# Patient Record
Sex: Male | Born: 1935 | Race: White | Hispanic: No | Marital: Married | Smoking: Former smoker
Health system: Southern US, Community
[De-identification: ages and names within clinical notes are randomized; demographics above are authoritative.]

## PROBLEM LIST (undated history)

## (undated) DIAGNOSIS — D649 Anemia, unspecified: Secondary | ICD-10-CM

## (undated) DIAGNOSIS — A809 Acute poliomyelitis, unspecified: Secondary | ICD-10-CM

## (undated) DIAGNOSIS — S72309A Unspecified fracture of shaft of unspecified femur, initial encounter for closed fracture: Secondary | ICD-10-CM

## (undated) DIAGNOSIS — D471 Chronic myeloproliferative disease: Secondary | ICD-10-CM

## (undated) DIAGNOSIS — K2210 Ulcer of esophagus without bleeding: Secondary | ICD-10-CM

## (undated) DIAGNOSIS — G9349 Other encephalopathy: Secondary | ICD-10-CM

## (undated) DIAGNOSIS — I4891 Unspecified atrial fibrillation: Secondary | ICD-10-CM

## (undated) DIAGNOSIS — I639 Cerebral infarction, unspecified: Secondary | ICD-10-CM

## (undated) DIAGNOSIS — S36039A Unspecified laceration of spleen, initial encounter: Secondary | ICD-10-CM

## (undated) DIAGNOSIS — N189 Chronic kidney disease, unspecified: Secondary | ICD-10-CM

## (undated) DIAGNOSIS — G9341 Metabolic encephalopathy: Secondary | ICD-10-CM

## (undated) DIAGNOSIS — N19 Unspecified kidney failure: Secondary | ICD-10-CM

## (undated) DIAGNOSIS — J189 Pneumonia, unspecified organism: Secondary | ICD-10-CM

## (undated) DIAGNOSIS — N186 End stage renal disease: Secondary | ICD-10-CM

## (undated) DIAGNOSIS — K922 Gastrointestinal hemorrhage, unspecified: Secondary | ICD-10-CM

## (undated) DIAGNOSIS — H3530 Unspecified macular degeneration: Secondary | ICD-10-CM

## (undated) DIAGNOSIS — I4892 Unspecified atrial flutter: Secondary | ICD-10-CM

## (undated) DIAGNOSIS — IMO0002 Reserved for concepts with insufficient information to code with codable children: Secondary | ICD-10-CM

## (undated) DIAGNOSIS — I219 Acute myocardial infarction, unspecified: Secondary | ICD-10-CM

## (undated) DIAGNOSIS — Z862 Personal history of diseases of the blood and blood-forming organs and certain disorders involving the immune mechanism: Secondary | ICD-10-CM

## (undated) HISTORY — PX: OTHER SURGICAL HISTORY: SHX169

## (undated) HISTORY — DX: Acute poliomyelitis, unspecified: A80.9

## (undated) HISTORY — DX: Unspecified kidney failure: N19

## (undated) HISTORY — DX: Reserved for concepts with insufficient information to code with codable children: IMO0002

## (undated) HISTORY — DX: Unspecified macular degeneration: H35.30

## (undated) HISTORY — DX: Acute myocardial infarction, unspecified: I21.9

## (undated) HISTORY — DX: Gastrointestinal hemorrhage, unspecified: K92.2

## (undated) HISTORY — DX: End stage renal disease: N18.6

## (undated) HISTORY — PX: EYE SURGERY: SHX253

## (undated) HISTORY — PX: COLON SURGERY: SHX602

## (undated) HISTORY — DX: Unspecified atrial flutter: I48.92

## (undated) HISTORY — DX: Other encephalopathy: G93.49

## (undated) HISTORY — DX: Ulcer of esophagus without bleeding: K22.10

## (undated) HISTORY — DX: Chronic kidney disease, unspecified: N18.9

## (undated) HISTORY — DX: Anemia, unspecified: D64.9

## (undated) HISTORY — DX: Chronic myeloproliferative disease: D47.1

## (undated) HISTORY — DX: Personal history of diseases of the blood and blood-forming organs and certain disorders involving the immune mechanism: Z86.2

## (undated) HISTORY — DX: Metabolic encephalopathy: G93.41

---
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
## 1994-03-16 DIAGNOSIS — I219 Acute myocardial infarction, unspecified: Secondary | ICD-10-CM

## 1994-03-16 HISTORY — PX: CARDIAC CATHETERIZATION: SHX172

## 1994-03-16 HISTORY — DX: Acute myocardial infarction, unspecified: I21.9

## 2002-03-16 HISTORY — PX: OTHER SURGICAL HISTORY: SHX169

## 2006-09-14 HISTORY — PX: COLON RESECTION: SHX5231

## 2012-06-30 ENCOUNTER — Other Ambulatory Visit: Payer: Self-pay | Admitting: *Deleted

## 2012-06-30 DIAGNOSIS — Z01810 Encounter for preprocedural cardiovascular examination: Secondary | ICD-10-CM

## 2012-06-30 DIAGNOSIS — N184 Chronic kidney disease, stage 4 (severe): Secondary | ICD-10-CM

## 2012-07-04 ENCOUNTER — Encounter: Payer: Self-pay | Admitting: Vascular Surgery

## 2012-07-18 ENCOUNTER — Encounter: Payer: Self-pay | Admitting: Vascular Surgery

## 2012-07-19 ENCOUNTER — Ambulatory Visit: Payer: Self-pay | Admitting: Vascular Surgery

## 2012-07-25 ENCOUNTER — Encounter: Payer: Self-pay | Admitting: Vascular Surgery

## 2012-07-26 ENCOUNTER — Ambulatory Visit (INDEPENDENT_AMBULATORY_CARE_PROVIDER_SITE_OTHER): Payer: Medicare Other | Admitting: Vascular Surgery

## 2012-07-26 ENCOUNTER — Encounter: Payer: Self-pay | Admitting: Vascular Surgery

## 2012-07-26 VITALS — BP 123/69 | HR 110 | Resp 20 | Ht 71.0 in | Wt 256.8 lb

## 2012-07-26 DIAGNOSIS — N186 End stage renal disease: Secondary | ICD-10-CM

## 2012-07-26 NOTE — Progress Notes (Signed)
[facility] [place]   Patient name: Brent Mcneil MRN: [id number] DOB: December 03, 1923 Sex: male   Referred by: Caryn Section  Reason for referral:  Chief Complaint  Patient presents with  . Follow-up    dialysis M-W-F  needs permanent access    HISTORY OF PRESENT ILLNESS: Patient is an 79 year old gentleman with a long history of progressive renal insufficiency. He initiated hemodialysis in [place] April. He had a dialysis catheter placed in an outside facility and has had a good hemodialysis via the catheter. I do have a venous mapping done at an outlying hospital and had this for review. He has multiple other medical code the comorbid oh these and is gets around in a motorized scooter  Past Medical History  Diagnosis Date  . Chronic kidney disease     HD- M-W-F  . End stage renal disease   . Uremic encephalopathy     Metabolic  . Anemia   . Gastrointestinal bleed   . Ulcer     Peptic and gastric   . Ulcerative esophagitis   . Atrial flutter   . Myeloproliferative disorder   . Macular degeneration 1990s    bilateral  . History of thrombocytosis   . Myocardial infarction 1996  . Polio age 47    Past Surgical History  Procedure Laterality Date  . Colon resection  09-2006  . Tonsillectomy  1958  . Orthopedic surgeries  as a child    due to polio  . Cataract surgery Right 2004    History   Social History  . Marital Status: Married    Spouse Name: N/A    Number of Children: N/A  . Years of Education: N/A   Occupational History  . Not on file.   Social History Main Topics  . Smoking status: Former Smoker    Quit date: 09/28/1956  . Smokeless tobacco: Not on file  . Alcohol Use: No  . Drug Use: No  . Sexually Active: Not on file   Other Topics Concern  . Not on file   Social History Narrative  . No narrative on file    History reviewed. No pertinent family history.  Allergies as of 07/26/2012  . (No Known Allergies)    Current  Outpatient Prescriptions on File Prior to Visit  Medication Sig Dispense Refill  . acetaminophen (TYLENOL) 100 MG/ML solution Take by mouth every 4 (four) hours as needed for fever. Takes 1 tablet 650 mg every 4 hours prn.      . anagrelide (AGRYLIN) 0.5 MG capsule Take 0.5 mg by mouth every other day.      Marland Kitchen aspirin 81 MG tablet Take 81 mg by mouth daily.      . beta carotene 15 MG capsule Take 15 mg by mouth daily.      . cetirizine (ZYRTEC) 10 MG tablet Take 10 mg by mouth daily.      Marland Kitchen diltiazem (CARDIZEM) 30 MG tablet Take 30 mg by mouth every 6 (six) hours.      . febuxostat (ULORIC) 40 MG tablet Take 40 mg by mouth daily.       . pantoprazole (PROTONIX) 40 MG tablet Take 40 mg by mouth daily.      . tamsulosin (FLOMAX) 0.4 MG CAPS Take by mouth.      . sodium bicarbonate 650 MG tablet Take 650 mg by mouth 2 (two) times daily.       No current facility-administered medications on file prior to  visit.     REVIEW OF SYSTEMS:  Positives indicated with an "X"  CARDIOVASCULAR:  [ ]  chest pain   [ ]  chest pressure   [ ]  palpitations   [ ]  orthopnea   [ ]  dyspnea on exertion   [ ]  claudication   [ ]  rest pain   [ ]  DVT   [ ]  phlebitis PULMONARY:   [ ]  productive cough   [ ]  asthma   [ ]  wheezing NEUROLOGIC:   [ ]  weakness  [ ]  paresthesias  [ ]  aphasia  [ ]  amaurosis  [ ]  dizziness HEMATOLOGIC:   [ ]  bleeding problems   [x ] clotting disorders MUSCULOSKELETAL:  [ ]  joint pain   [ ]  joint swelling GASTROINTESTINAL: [ ]   blood in stool  [ ]   hematemesis GENITOURINARY:  [x ]  dysuria  [ ]   hematuria PSYCHIATRIC:  [ ]  history of major depression INTEGUMENTARY:  [ ]  rashes  [ ]  ulcers CONSTITUTIONAL:  [ ]  fever   [ ]  chills  PHYSICAL EXAMINATION:  General: The patient is a well-nourished male, in no acute distress. Vital signs are BP 123/69  Pulse 110  Resp 20  Ht 5\' 11"  (1.803 m)  Wt 256 lb 13.4 oz (116.5 kg)  BMI 35.84 kg/m2 Pulmonary: There is a good air exchange bilaterally   Abdomen: Soft and non-tender  Musculoskeletal: There are no major deformities.  There is no significant extremity pain. Neurologic: No focal weakness or paresthesias are detected, Skin: There are no ulcer or rashes noted. Psychiatric: The patient has normal affect. 2+ radial pulses bilaterally    Vascular Lab Studies:  Outside venous acting shows inadequate cephalic vein on his right arm moderate size in the left arm. The basilic vein was small and diminutiv  the right arm and left arm myself and he does have acceptable cephalic vein in the left arm for a radiocephalic Cimino fistula creation Impression and Plan:  Impression and plan end-stage renal disease due to a long discussion with the patient and his daughter present explaining the catheter option AV fistula an AV graft option. I have recommended left AV fistula creation. The patient understands that it will require 3 months for maturation that he may have non-maturation. He dialyzes on Monday and Wednesday and Friday and therefore will be scheduled on a Tuesday or Thursday with surgery from one of my partners    Amel Kitch [facility] [place] Office: [phone]

## 2012-08-03 ENCOUNTER — Other Ambulatory Visit: Payer: Self-pay | Admitting: *Deleted

## 2012-08-12 ENCOUNTER — Encounter (HOSPITAL_COMMUNITY): Payer: Self-pay | Admitting: Pharmacy Technician

## 2012-08-14 DIAGNOSIS — I4891 Unspecified atrial fibrillation: Secondary | ICD-10-CM

## 2012-08-14 HISTORY — DX: Unspecified atrial fibrillation: I48.91

## 2012-08-17 ENCOUNTER — Encounter (HOSPITAL_COMMUNITY): Payer: Self-pay | Admitting: *Deleted

## 2012-08-17 MED ORDER — DEXTROSE 5 % IV SOLN
1.5000 g | INTRAVENOUS | Status: AC
  Administered 2012-08-18: 1.5 g via INTRAVENOUS
  Filled 2012-08-17: qty 1.5

## 2012-08-18 ENCOUNTER — Ambulatory Visit (HOSPITAL_COMMUNITY)
Admission: RE | Admit: 2012-08-18 | Discharge: 2012-08-18 | Disposition: A | Discharge status: Home / Self Care | Payer: Medicare Other | Source: Ambulatory Visit | Attending: Surgery | Admitting: Surgery

## 2012-08-18 ENCOUNTER — Ambulatory Visit (HOSPITAL_COMMUNITY): Payer: Medicare Other

## 2012-08-18 ENCOUNTER — Encounter (HOSPITAL_COMMUNITY): Payer: Self-pay | Admitting: Anesthesiology

## 2012-08-18 ENCOUNTER — Telehealth: Payer: Self-pay | Admitting: *Deleted

## 2012-08-18 ENCOUNTER — Ambulatory Visit (HOSPITAL_COMMUNITY): Payer: Medicare Other | Admitting: Anesthesiology

## 2012-08-18 ENCOUNTER — Encounter (HOSPITAL_COMMUNITY): Payer: Self-pay | Admitting: *Deleted

## 2012-08-18 ENCOUNTER — Encounter (HOSPITAL_COMMUNITY)
Admission: RE | Disposition: A | Discharge status: Home / Self Care | Payer: Self-pay | Source: Ambulatory Visit | Attending: Surgery

## 2012-08-18 ENCOUNTER — Telehealth: Payer: Self-pay | Admitting: Surgery

## 2012-08-18 DIAGNOSIS — Z992 Dependence on renal dialysis: Secondary | ICD-10-CM | POA: Insufficient documentation

## 2012-08-18 DIAGNOSIS — N186 End stage renal disease: Secondary | ICD-10-CM | POA: Insufficient documentation

## 2012-08-18 DIAGNOSIS — Z79899 Other long term (current) drug therapy: Secondary | ICD-10-CM | POA: Insufficient documentation

## 2012-08-18 DIAGNOSIS — I120 Hypertensive chronic kidney disease with stage 5 chronic kidney disease or end stage renal disease: Secondary | ICD-10-CM | POA: Insufficient documentation

## 2012-08-18 DIAGNOSIS — Z87891 Personal history of nicotine dependence: Secondary | ICD-10-CM | POA: Insufficient documentation

## 2012-08-18 DIAGNOSIS — Z6835 Body mass index (BMI) 35.0-35.9, adult: Secondary | ICD-10-CM | POA: Insufficient documentation

## 2012-08-18 DIAGNOSIS — H3530 Unspecified macular degeneration: Secondary | ICD-10-CM | POA: Insufficient documentation

## 2012-08-18 DIAGNOSIS — I252 Old myocardial infarction: Secondary | ICD-10-CM | POA: Insufficient documentation

## 2012-08-18 DIAGNOSIS — D47Z9 Other specified neoplasms of uncertain behavior of lymphoid, hematopoietic and related tissue: Secondary | ICD-10-CM | POA: Insufficient documentation

## 2012-08-18 DIAGNOSIS — I4892 Unspecified atrial flutter: Secondary | ICD-10-CM | POA: Insufficient documentation

## 2012-08-18 DIAGNOSIS — Z8612 Personal history of poliomyelitis: Secondary | ICD-10-CM | POA: Insufficient documentation

## 2012-08-18 DIAGNOSIS — D649 Anemia, unspecified: Secondary | ICD-10-CM | POA: Insufficient documentation

## 2012-08-18 DIAGNOSIS — Z7982 Long term (current) use of aspirin: Secondary | ICD-10-CM | POA: Insufficient documentation

## 2012-08-18 HISTORY — PX: AV FISTULA PLACEMENT: SHX1204

## 2012-08-18 LAB — POCT I-STAT 4, (NA,K, GLUC, HGB,HCT)
Glucose, Bld: 94 mg/dL (ref 70–99)
Hemoglobin: 15 g/dL (ref 13.0–17.0)
Potassium: 3.7 mEq/L (ref 3.5–5.1)

## 2012-08-18 LAB — SURGICAL PCR SCREEN: MRSA, PCR: NEGATIVE

## 2012-08-18 SURGERY — ARTERIOVENOUS (AV) FISTULA CREATION
Anesthesia: Monitor Anesthesia Care | Site: Arm Lower | Laterality: Left | Wound class: Clean

## 2012-08-18 MED ORDER — MUPIROCIN 2 % EX OINT
TOPICAL_OINTMENT | CUTANEOUS | Status: AC
  Filled 2012-08-18: qty 22

## 2012-08-18 MED ORDER — SODIUM CHLORIDE 0.9 % IV SOLN
INTRAVENOUS | Status: DC | PRN
  Administered 2012-08-18: 09:00:00 via INTRAVENOUS

## 2012-08-18 MED ORDER — MUPIROCIN 2 % EX OINT
TOPICAL_OINTMENT | Freq: Once | CUTANEOUS | Status: AC
  Filled 2012-08-18: qty 22

## 2012-08-18 MED ORDER — FENTANYL CITRATE 0.05 MG/ML IJ SOLN: 25.0000 ug | INTRAMUSCULAR | Status: DC | PRN

## 2012-08-18 MED ORDER — SODIUM CHLORIDE 0.9 % IV SOLN: INTRAVENOUS | Status: DC

## 2012-08-18 MED ORDER — PROTAMINE SULFATE 10 MG/ML IV SOLN
INTRAVENOUS | Status: DC | PRN
  Administered 2012-08-18: 10 mg via INTRAVENOUS
  Administered 2012-08-18: 15 mg via INTRAVENOUS

## 2012-08-18 MED ORDER — LIDOCAINE HCL (CARDIAC) 20 MG/ML IV SOLN
INTRAVENOUS | Status: DC | PRN
  Administered 2012-08-18: 20 mg via INTRAVENOUS

## 2012-08-18 MED ORDER — SODIUM CHLORIDE 0.9 % IR SOLN
Status: DC | PRN
  Administered 2012-08-18: 10:00:00

## 2012-08-18 MED ORDER — PROPOFOL INFUSION 10 MG/ML OPTIME
INTRAVENOUS | Status: DC | PRN
  Administered 2012-08-18: 75 ug/kg/min via INTRAVENOUS

## 2012-08-18 MED ORDER — FENTANYL CITRATE 0.05 MG/ML IJ SOLN
INTRAMUSCULAR | Status: DC | PRN
  Administered 2012-08-18 (×2): 50 ug via INTRAVENOUS

## 2012-08-18 MED ORDER — HEPARIN SODIUM (PORCINE) 1000 UNIT/ML IJ SOLN
INTRAMUSCULAR | Status: DC | PRN
  Administered 2012-08-18: 3000 [IU] via INTRAVENOUS

## 2012-08-18 MED ORDER — MIDAZOLAM HCL 5 MG/5ML IJ SOLN
INTRAMUSCULAR | Status: DC | PRN
  Administered 2012-08-18 (×2): 1 mg via INTRAVENOUS

## 2012-08-18 MED ORDER — ONDANSETRON HCL 4 MG/2ML IJ SOLN
INTRAMUSCULAR | Status: DC | PRN
  Administered 2012-08-18: 4 mg via INTRAVENOUS

## 2012-08-18 MED ORDER — OXYCODONE HCL 5 MG/5ML PO SOLN: 5.0000 mg | Freq: Once | ORAL | Status: DC | PRN

## 2012-08-18 MED ORDER — OXYCODONE HCL 5 MG PO TABS
5.0000 mg | ORAL_TABLET | ORAL | Status: DC | PRN
Start: 2012-08-18 — End: 2012-10-07

## 2012-08-18 MED ORDER — 0.9 % SODIUM CHLORIDE (POUR BTL) OPTIME
TOPICAL | Status: DC | PRN
  Administered 2012-08-18: 1000 mL

## 2012-08-18 MED ORDER — THROMBIN 20000 UNITS EX SOLR
CUTANEOUS | Status: AC
  Filled 2012-08-18: qty 20000

## 2012-08-18 MED ORDER — OXYCODONE HCL 5 MG PO TABS: 5.0000 mg | ORAL_TABLET | Freq: Once | ORAL | Status: DC | PRN

## 2012-08-18 MED ORDER — LIDOCAINE-EPINEPHRINE (PF) 1 %-1:200000 IJ SOLN
INTRAMUSCULAR | Status: AC
  Filled 2012-08-18: qty 10

## 2012-08-18 MED ORDER — HEMOSTATIC AGENTS (NO CHARGE) OPTIME
TOPICAL | Status: DC | PRN
  Administered 2012-08-18: 1 via TOPICAL

## 2012-08-18 MED ORDER — LIDOCAINE-EPINEPHRINE (PF) 1 %-1:200000 IJ SOLN
INTRAMUSCULAR | Status: DC | PRN
  Administered 2012-08-18: 10 mL

## 2012-08-18 SURGICAL SUPPLY — 36 items
ARMBAND PINK RESTRICT EXTREMIT (MISCELLANEOUS) ×2 IMPLANT
CANISTER SUCTION 2500CC (MISCELLANEOUS) ×2 IMPLANT
CLIP TI MEDIUM 6 (CLIP) ×2 IMPLANT
CLIP TI WIDE RED SMALL 6 (CLIP) ×2 IMPLANT
CLOTH BEACON ORANGE TIMEOUT ST (SAFETY) ×2 IMPLANT
COVER PROBE W GEL 5X96 (DRAPES) ×2 IMPLANT
COVER SURGICAL LIGHT HANDLE (MISCELLANEOUS) ×2 IMPLANT
DERMABOND ADVANCED (GAUZE/BANDAGES/DRESSINGS) ×1
DERMABOND ADVANCED .7 DNX12 (GAUZE/BANDAGES/DRESSINGS) ×1 IMPLANT
ELECT REM PT RETURN 9FT ADLT (ELECTROSURGICAL) ×2
ELECTRODE REM PT RTRN 9FT ADLT (ELECTROSURGICAL) ×1 IMPLANT
GLOVE BIOGEL M 6.5 STRL (GLOVE) ×4 IMPLANT
GLOVE BIOGEL PI IND STRL 6.5 (GLOVE) ×2 IMPLANT
GLOVE BIOGEL PI IND STRL 7.5 (GLOVE) ×2 IMPLANT
GLOVE BIOGEL PI INDICATOR 6.5 (GLOVE) ×2
GLOVE BIOGEL PI INDICATOR 7.5 (GLOVE) ×2
GLOVE ECLIPSE 7.0 STRL STRAW (GLOVE) ×2 IMPLANT
GLOVE SURG SS PI 7.0 STRL IVOR (GLOVE) ×4 IMPLANT
GLOVE SURG SS PI 7.5 STRL IVOR (GLOVE) ×2 IMPLANT
GOWN PREVENTION PLUS XXLARGE (GOWN DISPOSABLE) ×2 IMPLANT
GOWN STRL NON-REIN LRG LVL3 (GOWN DISPOSABLE) ×2 IMPLANT
GOWN STRL REIN XL XLG (GOWN DISPOSABLE) ×6 IMPLANT
HEMOSTAT SNOW SURGICEL 2X4 (HEMOSTASIS) ×2 IMPLANT
KIT BASIN OR (CUSTOM PROCEDURE TRAY) ×2 IMPLANT
KIT ROOM TURNOVER OR (KITS) ×2 IMPLANT
NS IRRIG 1000ML POUR BTL (IV SOLUTION) ×2 IMPLANT
PACK CV ACCESS (CUSTOM PROCEDURE TRAY) ×2 IMPLANT
PAD ARMBOARD 7.5X6 YLW CONV (MISCELLANEOUS) ×4 IMPLANT
SUT PROLENE 6 0 CC (SUTURE) ×4 IMPLANT
SUT VIC AB 3-0 SH 27 (SUTURE) ×1
SUT VIC AB 3-0 SH 27X BRD (SUTURE) ×1 IMPLANT
SUT VICRYL 4-0 PS2 18IN ABS (SUTURE) IMPLANT
TOWEL OR 17X24 6PK STRL BLUE (TOWEL DISPOSABLE) ×2 IMPLANT
TOWEL OR 17X26 10 PK STRL BLUE (TOWEL DISPOSABLE) ×2 IMPLANT
UNDERPAD 30X30 INCONTINENT (UNDERPADS AND DIAPERS) ×2 IMPLANT
WATER STERILE IRR 1000ML POUR (IV SOLUTION) ×2 IMPLANT

## 2012-08-18 NOTE — OR Nursing (Signed)
IVT to meet pt in ssc to flush/cap diatek

## 2012-08-18 NOTE — Telephone Encounter (Signed)
Brent Mcneil Shots at [facility] called to clarify Brent Mcneil discharge pain medicine order. It was written today by our PA, Della Goo, for Oxycodone 5 mg 1 to 2 every 4-6 hours prn pain. Being an Assisted living facility, they can not enter variables in a Rx, so they are entering this Rx as 1 every 4 hours as needed for pain relief. I asked Lianne Cure, PA and she said this would be all right. Brent Mcneil will make the appropriate order in their system. She will contact us if this Rx is not effective in relieving the patient's pain.

## 2012-08-18 NOTE — Op Note (Signed)
[facility] [place]  Patient name: Brent Mcneil MRN: [id number] DOB: 19-Apr-1923 Sex: male  08/18/2012 Pre-operative Diagnosis: ESRD Post-operative diagnosis:  Same Surgeon:  Jorge Ny Assistants:  Doreatha Massed Procedure:   Left radiocephalic fistula Anesthesia:  MAC Blood Loss:  See anesthesia record Specimens:  None  Findings:  Vein distended to approximately 3.5 mm. The artery was calcified.  Indications:  The patient comes in for dialysis access. He was found to be in new onset A. fib approximately 1-2 weeks ago. We discussed this and elected to proceed. The patient will see a cardiologist and aspirin in the immediate future.  Procedure:  The patient was identified in the holding area and taken to [facility] OR ROOM 12  The patient was then placed supine on the table. MAC anesthesia was administered.  The patient was prepped and draped in the usual sterile fashion.  A time out was called and antibiotics were administered.  Ultrasound was used to map the course of the cephalic vein in the upper arm. Appeared to be adequate. One percent lidocaine was used for local anesthesia. A longitudinal incision was made between the artery and vein. The vein was sharply dissected free and was approximately 2 mm in situ. Side branches were ligated between silk ties. The vein was then marked with ink pen for orientation. I then dissected out the radial artery. This was a 2 mm artery which was moderately calcified. Once adequate exposure was obtained, the patient was given 3000 units of heparin. The vein was ligated distally. It distended to approximately 3.5 mm with heparinized saline. The artery was then occluded with vascular clamps. A #11 blade was used to make an arteriotomy which was extended longitudinally with Potts scissors. The vein was cut to length and then spatulated. A running end to side anastomosis was created with 6-0 Prolene. Prior to completion, the appropriate  flushing maneuvers were performed. The anastomosis was completed. There was a good thrill within the fistula up to the elbow. There was adequate, biphasic Doppler signal in the distal radial artery. 25 mg of protamine was administered. After the wound bed was hemostatic, the tissue was closed in 2 layers of 3-0 Vicryl and Dermabond was placed. There were no immediate complications.   Disposition:  To PACU in stable condition.   Juleen China, M.D. [facility] [place] Office: [phone] Pager:  [phone]

## 2012-08-18 NOTE — Anesthesia Procedure Notes (Signed)
Procedure Name: MAC Date/Time: 08/18/2012 9:35 AM Performed by: Brien Mates DOBSON Pre-anesthesia Checklist: Patient identified, Emergency Drugs available, Suction available, Patient being monitored and Timeout performed Patient Re-evaluated:Patient Re-evaluated prior to inductionOxygen Delivery Method: Simple face mask

## 2012-08-18 NOTE — Anesthesia Preprocedure Evaluation (Addendum)
Anesthesia Evaluation  Patient identified by MRN, date of birth, ID band Patient awake    Reviewed: Allergy & Precautions, H&P , NPO status , Patient's Chart, lab work & pertinent test results  History of Anesthesia Complications Negative for: history of anesthetic complications  Airway Mallampati: II TM Distance: >3 FB Neck ROM: Full    Dental  (+) Teeth Intact and Dental Advisory Given   Pulmonary former smoker,  breath sounds clear to auscultation        Cardiovascular hypertension, Pt. on medications + Past MI + dysrhythmias Atrial Fibrillation Rhythm:Irregular Rate:Normal     Neuro/Psych Mini strokes in the past - no residual.    GI/Hepatic Neg liver ROS, PUD, GERD-  Medicated and Controlled,  Endo/Other  negative endocrine ROS  Renal/GU ESRF and DialysisRenal diseaseHD x 6 weeks     Musculoskeletal   Abdominal   Peds  Hematology  (+) Blood dyscrasia, ,   Anesthesia Other Findings   Reproductive/Obstetrics negative OB ROS                          Anesthesia Physical Anesthesia Plan  ASA: III  Anesthesia Plan: MAC   Post-op Pain Management:    Induction: Intravenous  Airway Management Planned: Natural Airway  Additional Equipment:   Intra-op Plan:   Post-operative Plan: Extubation in OR  Informed Consent: I have reviewed the patients History and Physical, chart, labs and discussed the procedure including the risks, benefits and alternatives for the proposed anesthesia with the patient or authorized representative who has indicated his/her understanding and acceptance.     Plan Discussed with: CRNA and Surgeon  Anesthesia Plan Comments:         Anesthesia Quick Evaluation

## 2012-08-18 NOTE — Preoperative (Signed)
Beta Blockers   Reason not to administer Beta Blockers:Not Applicable 

## 2012-08-18 NOTE — Telephone Encounter (Addendum)
Message copied by Fredrich Birks on Thu Aug 18, 2012 12:27 PM ------      Message from: Melene Plan      Created: Thu Aug 18, 2012 10:47 AM                   ----- Message -----         From: Marlowe Shores, PA-C         Sent: 08/18/2012  10:28 AM           To: Melene Plan, RN, [facility] Pool            4-6 week F/U AVF - Brab ------  Spoke with patients daughter Arline Asp to notify, dpm

## 2012-08-18 NOTE — Interval H&P Note (Signed)
History and Physical Interval Note:  08/18/2012 7:31 AM  Brent Mcneil  has presented today for surgery, with the diagnosis of ESRD  The various methods of treatment have been discussed with the patient and family. After consideration of risks, benefits and other options for treatment, the patient has consented to  Procedure(s) with comments: ARTERIOVENOUS (AV) FISTULA CREATION (Left) - CIMINO as a surgical intervention .  The patient's history has been reviewed, patient examined, no change in status, stable for surgery.  I have reviewed the patient's chart and labs.  Questions were answered to the patient's satisfaction.     Brent Mcneil, V. WELL[facility]

## 2012-08-18 NOTE — OR Nursing (Signed)
Spoke with dr Myra Gianotti re need for f/u "new onset afib"...states has appt 6/19 with [facility], going home today.

## 2012-08-18 NOTE — Transfer of Care (Signed)
Immediate Anesthesia Transfer of Care Note  Patient: Brent Mcneil  Procedure(s) Performed: Procedure(s): ARTERIOVENOUS (AV) FISTULA CREATION (Left)  Patient Location: PACU  Anesthesia Type:MAC  Level of Consciousness: awake, alert , oriented and patient cooperative  Airway & Oxygen Therapy: Patient Spontanous Breathing and Patient connected to face mask oxygen  Post-op Assessment: Report given to PACU RN and Post -op Vital signs reviewed and stable  Post vital signs: Reviewed and stable  Complications: No apparent anesthesia complications

## 2012-08-18 NOTE — Progress Notes (Signed)
Dr. Jacklynn Bue called and notified of EKG results.  Anesthethia to assess pt in holding.  Advance Directive and HCPOA papers copied and placed in chart.

## 2012-08-18 NOTE — Anesthesia Postprocedure Evaluation (Signed)
  Anesthesia Post-op Note  Patient: Brent Mcneil  Procedure(s) Performed: Procedure(s): ARTERIOVENOUS (AV) FISTULA CREATION (Left)  Patient Location: PACU  Anesthesia Type:MAC  Level of Consciousness: awake  Airway and Oxygen Therapy: Patient Spontanous Breathing  Post-op Pain: mild  Post-op Assessment: Post-op Vital signs reviewed  Post-op Vital Signs: stable  Complications: No apparent anesthesia complications

## 2012-08-18 NOTE — H&P (View-Only) (Signed)
[facility]ist of [place]   Patient name: Brent Mcneil MRN: [id number] DOB: 09/23/1923 Sex: male   Referred by: Fox  Reason for referral:  Chief Complaint  Patient presents with  . Follow-up    dialysis M-W-F  needs permanent access    HISTORY OF PRESENT ILLNESS: Patient is an 79-year-old gentleman with a long history of progressive renal insufficiency. He initiated hemodialysis in [place] April. He had a dialysis catheter placed in an outside facility and has had a good hemodialysis via the catheter. I do have a venous mapping done at an outlying hospital and had this for review. He has multiple other medical code the comorbid oh these and is gets around in a motorized scooter  Past Medical History  Diagnosis Date  . Chronic kidney disease     HD- M-W-F  . End stage renal disease   . Uremic encephalopathy     Metabolic  . Anemia   . Gastrointestinal bleed   . Ulcer     Peptic and gastric   . Ulcerative esophagitis   . Atrial flutter   . Myeloproliferative disorder   . Macular degeneration 1990s    bilateral  . History of thrombocytosis   . Myocardial infarction 1996  . Polio age 4    Past Surgical History  Procedure Laterality Date  . Colon resection  09-2006  . Tonsillectomy  1958  . Orthopedic surgeries  as a child    due to polio  . Cataract surgery Right 2004    History   Social History  . Marital Status: Married    Spouse Name: N/A    Number of Children: N/A  . Years of Education: N/A   Occupational History  . Not on file.   Social History Main Topics  . Smoking status: Former Smoker    Quit date: 09/28/1956  . Smokeless tobacco: Not on file  . Alcohol Use: No  . Drug Use: No  . Sexually Active: Not on file   Other Topics Concern  . Not on file   Social History Narrative  . No narrative on file    History reviewed. No pertinent family history.  Allergies as of 07/26/2012  . (No Known Allergies)    Current  Outpatient Prescriptions on File Prior to Visit  Medication Sig Dispense Refill  . acetaminophen (TYLENOL) 100 MG/ML solution Take by mouth every 4 (four) hours as needed for fever. Takes 1 tablet 650 mg every 4 hours prn.      . anagrelide (AGRYLIN) 0.5 MG capsule Take 0.5 mg by mouth every other day.      . aspirin 81 MG tablet Take 81 mg by mouth daily.      . beta carotene 15 MG capsule Take 15 mg by mouth daily.      . cetirizine (ZYRTEC) 10 MG tablet Take 10 mg by mouth daily.      . diltiazem (CARDIZEM) 30 MG tablet Take 30 mg by mouth every 6 (six) hours.      . febuxostat (ULORIC) 40 MG tablet Take 40 mg by mouth daily.       . pantoprazole (PROTONIX) 40 MG tablet Take 40 mg by mouth daily.      . tamsulosin (FLOMAX) 0.4 MG CAPS Take by mouth.      . sodium bicarbonate 650 MG tablet Take 650 mg by mouth 2 (two) times daily.       No current facility-administered medications on file prior to   visit.     REVIEW OF SYSTEMS:  Positives indicated with an "X"  CARDIOVASCULAR:  [ ] chest pain   [ ] chest pressure   [ ] palpitations   [ ] orthopnea   [ ] dyspnea on exertion   [ ] claudication   [ ] rest pain   [ ] DVT   [ ] phlebitis PULMONARY:   [ ] productive cough   [ ] asthma   [ ] wheezing NEUROLOGIC:   [ ] weakness  [ ] paresthesias  [ ] aphasia  [ ] amaurosis  [ ] dizziness HEMATOLOGIC:   [ ] bleeding problems   [x ] clotting disorders MUSCULOSKELETAL:  [ ] joint pain   [ ] joint swelling GASTROINTESTINAL: [ ]  blood in stool  [ ]  hematemesis GENITOURINARY:  [x ]  dysuria  [ ]  hematuria PSYCHIATRIC:  [ ] history of major depression INTEGUMENTARY:  [ ] rashes  [ ] ulcers CONSTITUTIONAL:  [ ] fever   [ ] chills  PHYSICAL EXAMINATION:  General: The patient is a well-nourished male, in no acute distress. Vital signs are BP 123/69  Pulse 110  Resp 20  Ht 5' 11" (1.803 m)  Wt 256 lb 13.4 oz (116.5 kg)  BMI 35.84 kg/m2 Pulmonary: There is a good air exchange bilaterally   Abdomen: Soft and non-tender  Musculoskeletal: There are no major deformities.  There is no significant extremity pain. Neurologic: No focal weakness or paresthesias are detected, Skin: There are no ulcer or rashes noted. Psychiatric: The patient has normal affect. 2+ radial pulses bilaterally    Vascular Lab Studies:  Outside venous acting shows inadequate cephalic vein on his right arm moderate size in the left arm. The basilic vein was small and diminutiv  the right arm and left arm myself and he does have acceptable cephalic vein in the left arm for a radiocephalic Cimino fistula creation Impression and Plan:  Impression and plan end-stage renal disease due to a long discussion with the patient and his daughter present explaining the catheter option AV fistula an AV graft option. I have recommended left AV fistula creation. The patient understands that it will require 3 months for maturation that he may have non-maturation. He dialyzes on Monday and Wednesday and Friday and therefore will be scheduled on a Tuesday or Thursday with surgery from one of my partners    Romain Erion [facility] [place] Office: [phone]         

## 2012-08-19 ENCOUNTER — Encounter (HOSPITAL_COMMUNITY): Payer: Self-pay | Admitting: Surgery

## 2012-09-26 ENCOUNTER — Ambulatory Visit: Payer: Medicare Other | Admitting: Surgery

## 2012-10-03 ENCOUNTER — Encounter: Payer: Self-pay | Admitting: Vascular Surgery

## 2012-10-04 ENCOUNTER — Ambulatory Visit (INDEPENDENT_AMBULATORY_CARE_PROVIDER_SITE_OTHER): Payer: Medicare Other | Admitting: Vascular Surgery

## 2012-10-04 ENCOUNTER — Encounter: Payer: Self-pay | Admitting: Vascular Surgery

## 2012-10-04 ENCOUNTER — Encounter (INDEPENDENT_AMBULATORY_CARE_PROVIDER_SITE_OTHER): Payer: Medicare Other | Admitting: *Deleted

## 2012-10-04 VITALS — BP 150/76 | HR 62 | Temp 97.8°F | Resp 16 | Ht 71.0 in | Wt 235.0 lb

## 2012-10-04 DIAGNOSIS — Z48812 Encounter for surgical aftercare following surgery on the circulatory system: Secondary | ICD-10-CM

## 2012-10-04 DIAGNOSIS — N186 End stage renal disease: Secondary | ICD-10-CM

## 2012-10-04 DIAGNOSIS — N184 Chronic kidney disease, stage 4 (severe): Secondary | ICD-10-CM

## 2012-10-04 DIAGNOSIS — Z01810 Encounter for preprocedural cardiovascular examination: Secondary | ICD-10-CM

## 2012-10-04 NOTE — Progress Notes (Signed)
Here today for followup of AV fistula creation by Dr. Brabham on 08/18/2012. He is being dialyzed via a hemodialysis catheter. His daughter is with him today and reports that he has occasional poor flow but in general is doing well with the catheter. There has been no evidence of infection  On physical exam he is a well-healed incision in his left wrist with a thrill apparatus. The vein above this is relatively small in diameter.  Venous duplex today reveals a tight velocity at the arteriovenous anastomosis. In reviewing Dr. Brabham's operative note he does report the patient had an atherosclerotic radial artery. Velocities are over 700 cm/s. The vein is of moderate size throughout the forearm. It is significantly larger at the antecubital space and more proximally.  Had a long discussion with the patient and his daughter present. I did not feel that he will obtain adequate size of his AV fistula is currently exists. I explained options of attempted salvage of this AV fistula with revision of the arterial anastomosis. Also explained potential angioplasty of this however filled this would be unlikely to correct the issue of the atherosclerotic radial artery experience at the time of surgery. I would recommend redo surgical revision of the current anastomosis for placement of a left upper arm AV fistula. My bias would be towards placement of a new upper arm fistula. He dialyzes on Monday Wednesday Fridays and therefore would require a Tuesday or Thursday surgery. We will schedule this with Dr. Brabham in for a final decision regarding revision of the Cimino fistula versus new upper arm fistula with Dr. Brabham 

## 2012-10-05 ENCOUNTER — Other Ambulatory Visit: Payer: Self-pay

## 2012-10-07 ENCOUNTER — Encounter (HOSPITAL_COMMUNITY): Payer: Self-pay | Admitting: Pharmacist

## 2012-10-12 ENCOUNTER — Encounter (HOSPITAL_COMMUNITY): Payer: Self-pay | Admitting: *Deleted

## 2012-10-12 MED ORDER — DEXTROSE 5 % IV SOLN
1.5000 g | INTRAVENOUS | Status: AC
  Administered 2012-10-13: 1.5 g via INTRAVENOUS
  Filled 2012-10-12: qty 1.5

## 2012-10-12 NOTE — Progress Notes (Signed)
Received Echo results from [facility] and shown to [facility]. She feels that the EKG that is in EPIC will be fine and pt does not need a repeat one on DOS.

## 2012-10-12 NOTE — Progress Notes (Signed)
Spoke with wife, Corrie Dandy for pre-op call per pt request. Patient and wife live at an assisted living and staff gives patient his medications.  I called and spoke with Britta Mccreedy and gave her the list of meds that the patient could have in the am and requested that they send a copy of his updated [facility] with him tomorrow. Pt has been seen by his cardiologist since the last EKG that we have in EPIC that shows A-fib, will request recent EKG and office visit notes from Dr. Tomie China with [facility].

## 2012-10-12 NOTE — Progress Notes (Signed)
Received last 2 office visits from [facility].  Discussed with Revonda Standard, [place] about need for an EKG on DOS. She requested that I ask for a copy of the Echo that was done.  [facility] and they will be faxing it shortly.

## 2012-10-13 ENCOUNTER — Encounter (HOSPITAL_COMMUNITY): Payer: Self-pay | Admitting: Anesthesiology

## 2012-10-13 ENCOUNTER — Ambulatory Visit (HOSPITAL_COMMUNITY): Payer: Medicare Other | Admitting: Anesthesiology

## 2012-10-13 ENCOUNTER — Ambulatory Visit (HOSPITAL_COMMUNITY)
Admission: RE | Admit: 2012-10-13 | Discharge: 2012-10-13 | Disposition: A | Discharge status: Nursing Home (Includes ICF) | Payer: Medicare Other | Source: Ambulatory Visit | Attending: Surgery | Admitting: Surgery

## 2012-10-13 ENCOUNTER — Encounter (HOSPITAL_COMMUNITY)
Admission: RE | Disposition: A | Discharge status: Nursing Home (Includes ICF) | Payer: Self-pay | Source: Ambulatory Visit | Attending: Surgery

## 2012-10-13 DIAGNOSIS — I4892 Unspecified atrial flutter: Secondary | ICD-10-CM | POA: Insufficient documentation

## 2012-10-13 DIAGNOSIS — D649 Anemia, unspecified: Secondary | ICD-10-CM | POA: Insufficient documentation

## 2012-10-13 DIAGNOSIS — Z87891 Personal history of nicotine dependence: Secondary | ICD-10-CM | POA: Insufficient documentation

## 2012-10-13 DIAGNOSIS — Z79899 Other long term (current) drug therapy: Secondary | ICD-10-CM | POA: Insufficient documentation

## 2012-10-13 DIAGNOSIS — D47Z9 Other specified neoplasms of uncertain behavior of lymphoid, hematopoietic and related tissue: Secondary | ICD-10-CM | POA: Insufficient documentation

## 2012-10-13 DIAGNOSIS — Z992 Dependence on renal dialysis: Secondary | ICD-10-CM | POA: Insufficient documentation

## 2012-10-13 DIAGNOSIS — Z8612 Personal history of poliomyelitis: Secondary | ICD-10-CM | POA: Insufficient documentation

## 2012-10-13 DIAGNOSIS — H3530 Unspecified macular degeneration: Secondary | ICD-10-CM | POA: Insufficient documentation

## 2012-10-13 DIAGNOSIS — N186 End stage renal disease: Secondary | ICD-10-CM | POA: Insufficient documentation

## 2012-10-13 DIAGNOSIS — I252 Old myocardial infarction: Secondary | ICD-10-CM | POA: Insufficient documentation

## 2012-10-13 HISTORY — DX: Pneumonia, unspecified organism: J18.9

## 2012-10-13 HISTORY — PX: AV FISTULA PLACEMENT: SHX1204

## 2012-10-13 HISTORY — DX: Unspecified atrial fibrillation: I48.91

## 2012-10-13 HISTORY — DX: Cerebral infarction, unspecified: I63.9

## 2012-10-13 LAB — POCT I-STAT 4, (NA,K, GLUC, HGB,HCT)
Glucose, Bld: 95 mg/dL (ref 70–99)
HCT: 40 % (ref 39.0–52.0)
Hemoglobin: 13.6 g/dL (ref 13.0–17.0)
Potassium: 3.2 mEq/L — ABNORMAL LOW (ref 3.5–5.1)
Sodium: 134 mEq/L — ABNORMAL LOW (ref 135–145)

## 2012-10-13 SURGERY — ARTERIOVENOUS (AV) FISTULA CREATION
Anesthesia: Monitor Anesthesia Care | Site: Arm Upper | Laterality: Left

## 2012-10-13 MED ORDER — PROPOFOL INFUSION 10 MG/ML OPTIME
INTRAVENOUS | Status: DC | PRN
  Administered 2012-10-13: 100 ug/kg/min via INTRAVENOUS

## 2012-10-13 MED ORDER — HEPARIN SODIUM (PORCINE) 1000 UNIT/ML IJ SOLN
INTRAMUSCULAR | Status: DC | PRN
  Administered 2012-10-13: 3000 [IU] via INTRAVENOUS

## 2012-10-13 MED ORDER — FENTANYL CITRATE 0.05 MG/ML IJ SOLN
INTRAMUSCULAR | Status: DC | PRN
  Administered 2012-10-13: 50 ug via INTRAVENOUS
  Administered 2012-10-13 (×2): 25 ug via INTRAVENOUS

## 2012-10-13 MED ORDER — SODIUM CHLORIDE 0.9 % IV SOLN
INTRAVENOUS | Status: DC
  Administered 2012-10-13: 12:00:00 via INTRAVENOUS

## 2012-10-13 MED ORDER — OXYCODONE HCL 5 MG PO TABS: 5.0000 mg | ORAL_TABLET | Freq: Four times a day (QID) | ORAL | Status: DC | PRN

## 2012-10-13 MED ORDER — LIDOCAINE-EPINEPHRINE (PF) 1 %-1:200000 IJ SOLN
INTRAMUSCULAR | Status: AC
  Filled 2012-10-13: qty 10

## 2012-10-13 MED ORDER — PROTAMINE SULFATE 10 MG/ML IV SOLN
INTRAVENOUS | Status: DC | PRN
  Administered 2012-10-13: 5 mg via INTRAVENOUS
  Administered 2012-10-13 (×2): 10 mg via INTRAVENOUS

## 2012-10-13 MED ORDER — HEPARIN SODIUM (PORCINE) 5000 UNIT/ML IJ SOLN
INTRAMUSCULAR | Status: DC | PRN
  Administered 2012-10-13: 16:00:00

## 2012-10-13 MED ORDER — PROPOFOL 10 MG/ML IV BOLUS
INTRAVENOUS | Status: DC | PRN
  Administered 2012-10-13: 20 mg via INTRAVENOUS

## 2012-10-13 MED ORDER — LIDOCAINE HCL (CARDIAC) 20 MG/ML IV SOLN
INTRAVENOUS | Status: DC | PRN
  Administered 2012-10-13: 30 mg via INTRAVENOUS

## 2012-10-13 MED ORDER — MIDAZOLAM HCL 5 MG/5ML IJ SOLN
INTRAMUSCULAR | Status: DC | PRN
  Administered 2012-10-13 (×2): 1 mg via INTRAVENOUS

## 2012-10-13 MED ORDER — LIDOCAINE-EPINEPHRINE (PF) 1 %-1:200000 IJ SOLN
INTRAMUSCULAR | Status: DC | PRN
  Administered 2012-10-13: 20 mL

## 2012-10-13 MED ORDER — PHENYLEPHRINE HCL 10 MG/ML IJ SOLN
INTRAMUSCULAR | Status: DC | PRN
  Administered 2012-10-13 (×5): 80 ug via INTRAVENOUS

## 2012-10-13 MED ORDER — 0.9 % SODIUM CHLORIDE (POUR BTL) OPTIME
TOPICAL | Status: DC | PRN
  Administered 2012-10-13: 1000 mL

## 2012-10-13 SURGICAL SUPPLY — 34 items
CANISTER SUCTION 2500CC (MISCELLANEOUS) ×3 IMPLANT
CLIP TI MEDIUM 6 (CLIP) ×3 IMPLANT
CLIP TI WIDE RED SMALL 6 (CLIP) ×3 IMPLANT
CLOTH BEACON ORANGE TIMEOUT ST (SAFETY) ×3 IMPLANT
COVER PROBE W GEL 5X96 (DRAPES) ×3 IMPLANT
COVER SURGICAL LIGHT HANDLE (MISCELLANEOUS) ×3 IMPLANT
DERMABOND ADVANCED (GAUZE/BANDAGES/DRESSINGS) ×1
DERMABOND ADVANCED .7 DNX12 (GAUZE/BANDAGES/DRESSINGS) ×2 IMPLANT
ELECT REM PT RETURN 9FT ADLT (ELECTROSURGICAL) ×3
ELECTRODE REM PT RTRN 9FT ADLT (ELECTROSURGICAL) ×2 IMPLANT
GLOVE BIOGEL PI IND STRL 6.5 (GLOVE) ×4 IMPLANT
GLOVE BIOGEL PI IND STRL 7.5 (GLOVE) ×4 IMPLANT
GLOVE BIOGEL PI INDICATOR 6.5 (GLOVE) ×2
GLOVE BIOGEL PI INDICATOR 7.5 (GLOVE) ×2
GLOVE ECLIPSE 6.5 STRL STRAW (GLOVE) ×3 IMPLANT
GLOVE SS BIOGEL STRL SZ 7 (GLOVE) ×2 IMPLANT
GLOVE SUPERSENSE BIOGEL SZ 7 (GLOVE) ×1
GLOVE SURG SS PI 7.5 STRL IVOR (GLOVE) ×3 IMPLANT
GOWN PREVENTION PLUS XXLARGE (GOWN DISPOSABLE) IMPLANT
GOWN STRL NON-REIN LRG LVL3 (GOWN DISPOSABLE) IMPLANT
HEMOSTAT SNOW SURGICEL 2X4 (HEMOSTASIS) IMPLANT
KIT BASIN OR (CUSTOM PROCEDURE TRAY) ×3 IMPLANT
KIT ROOM TURNOVER OR (KITS) ×3 IMPLANT
NS IRRIG 1000ML POUR BTL (IV SOLUTION) ×3 IMPLANT
PACK CV ACCESS (CUSTOM PROCEDURE TRAY) ×3 IMPLANT
PAD ARMBOARD 7.5X6 YLW CONV (MISCELLANEOUS) ×6 IMPLANT
SUT PROLENE 6 0 CC (SUTURE) ×6 IMPLANT
SUT VIC AB 3-0 SH 27 (SUTURE) ×1
SUT VIC AB 3-0 SH 27X BRD (SUTURE) ×2 IMPLANT
SUT VICRYL 4-0 PS2 18IN ABS (SUTURE) ×3 IMPLANT
TOWEL OR 17X24 6PK STRL BLUE (TOWEL DISPOSABLE) ×3 IMPLANT
TOWEL OR 17X26 10 PK STRL BLUE (TOWEL DISPOSABLE) ×3 IMPLANT
UNDERPAD 30X30 INCONTINENT (UNDERPADS AND DIAPERS) ×3 IMPLANT
WATER STERILE IRR 1000ML POUR (IV SOLUTION) ×3 IMPLANT

## 2012-10-13 NOTE — Anesthesia Preprocedure Evaluation (Addendum)
Anesthesia Evaluation  Patient identified by MRN, date of birth, ID band Patient awake    Reviewed: Allergy & Precautions, H&P , NPO status , Patient's Chart, lab work & pertinent test results  Airway Mallampati: II TM Distance: >3 FB Neck ROM: Full    Dental  (+) Teeth Intact and Dental Advisory Given   Pulmonary pneumonia -, resolved, former smoker,  H/o polio         Cardiovascular hypertension, + Past MI + dysrhythmias Atrial Fibrillation Rhythm:Regular Rate:Normal     Neuro/Psych H/o uremic encephalopathy  CVA, No Residual Symptoms    GI/Hepatic Neg liver ROS, PUD, GERD-  Medicated and Controlled,  Endo/Other  negative endocrine ROS  Renal/GU ESRF and DialysisRenal disease (MWF)     Musculoskeletal  (+) Arthritis -, Osteoarthritis,    Abdominal   Peds  Hematology  (+) Blood dyscrasia, anemia ,   Anesthesia Other Findings   Reproductive/Obstetrics                       Anesthesia Physical Anesthesia Plan  ASA: III  Anesthesia Plan: MAC   Post-op Pain Management:    Induction: Intravenous  Airway Management Planned:   Additional Equipment:   Intra-op Plan:   Post-operative Plan:   Informed Consent: I have reviewed the patients History and Physical, chart, labs and discussed the procedure including the risks, benefits and alternatives for the proposed anesthesia with the patient or authorized representative who has indicated his/her understanding and acceptance.   Dental advisory given  Plan Discussed with: CRNA, Anesthesiologist and Surgeon  Anesthesia Plan Comments:         Anesthesia Quick Evaluation

## 2012-10-13 NOTE — Anesthesia Postprocedure Evaluation (Signed)
  Anesthesia Post-op Note  Patient: Brent Mcneil  Procedure(s) Performed: Procedure(s): ARTERIOVENOUS (AV) FISTULA CREATION (Left)  Patient Location: PACU  Anesthesia Type:MAC  Level of Consciousness: awake  Airway and Oxygen Therapy: Patient Spontanous Breathing  Post-op Pain: mild  Post-op Assessment: Post-op Vital signs reviewed, Patient's Cardiovascular Status Stable, Respiratory Function Stable, Patent Airway, No signs of Nausea or vomiting and Pain level controlled  Post-op Vital Signs: stable  Complications: No apparent anesthesia complications

## 2012-10-13 NOTE — Progress Notes (Signed)
Dialysis access report faxed to [facility] dialysis center

## 2012-10-13 NOTE — Op Note (Signed)
[facility]  Patient name: Brent Mcneil MRN: [id number] DOB: December 09, 1923 Sex: male  10/13/2012 Pre-operative Diagnosis: End-stage renal disease Post-operative diagnosis:  Same Surgeon:  Jorge Ny Assistants:  Narda Amber Procedure:   Left brachiocephalic fistula Anesthesia:  MAC Blood Loss:  See anesthesia record Specimens:  None  Findings:  The median cubital vein was utilized for the anastomosis. This was an excellent caliber vein, approximately 4 mm. The artery was also of excellent caliber. I did ligate the distal cephalic vein.  Indications:  The patient is previously undergone left wrist fistula which did not mature he is back today for a new fistula.  Procedure:  The patient was identified in the holding area and taken to [facility] OR ROOM 11  The patient was then placed supine on the table. MAC anesthesia was administered.  The patient was prepped and draped in the usual sterile fashion.  A time out was called and antibiotics were administered.  Ultrasound was used to map the cephalic vein. There appeared to be a large median cubital vein which would be adequate for fistula creation. One percent lidocaine was used for local anesthesia. A transverse incision was made antecubital crease. The vein was then dissected free. This was an excellent vein approximately 4 mm. I then isolated the artery. Again the artery was also of excellent caliber, 3.5 mm without any disease. 3000 units of heparin were administered. The vein was ligated distally. There was good backbleeding from the vein, likely from the previously created fistula which remains patent. The vein was easily flushed with heparinized saline. It was occluded with Serafin clamp. The artery was then occluded with Serafin clamps. A #11 blade was used to make an arteriotomy which was extended longitudinally with Potts scissors. The vein was spatulated and then sewn end to side to the brachial artery with  6-0 Prolene. Prior to completion, the appropriate flushing maneuvers were performed and the anastomosis was completed. There was an excellent thrill within the fistula, and the patient had a palpable radial pulse. Excellent Doppler signals were also obtained in the fistula as well as radial artery. I inspected the course of the vein and dissected it back to the junction with the cephalic vein. I ligated the cephalic vein on the distal side. The remainder excellent thrill within the fistula. 25 mg of protamine was administered. Was irrigated. Once hemostasis was satisfactory, the incision was closed in 2 layers of 3-0 Vicryl. Dermabond applied. There were no complications.   Disposition:  To PACU in stable condition.   Juleen China, M.D. [facility] Office: [phone] Pager:  [phone]

## 2012-10-13 NOTE — Preoperative (Signed)
Beta Blockers   Reason not to administer Beta Blockers:carvedilol 10/13/12

## 2012-10-13 NOTE — Transfer of Care (Signed)
Immediate Anesthesia Transfer of Care Note  Patient: Brent Mcneil  Procedure(s) Performed: Procedure(s): ARTERIOVENOUS (AV) FISTULA CREATION (Left)  Patient Location: PACU  Anesthesia Type:MAC  Level of Consciousness: awake, alert , oriented and patient cooperative  Airway & Oxygen Therapy: Patient Spontanous Breathing and Patient connected to face mask oxygen  Post-op Assessment: Report given to PACU RN and Post -op Vital signs reviewed and stable  Post vital signs: Reviewed and stable  Complications: No apparent anesthesia complications

## 2012-10-13 NOTE — Interval H&P Note (Signed)
History and Physical Interval Note:  10/13/2012 2:36 PM  Brent Mcneil  has presented today for surgery, with the diagnosis of End Stage Renal Disease  The various methods of treatment have been discussed with the patient and family. After consideration of risks, benefits and other options for treatment, the patient has consented to  Procedure(s): REVISON OF LEFT ARM CIMINO ARTERIOVENOUS FISTULA VS CREATION NEW LEFT UPPER ARM ARTERIOVENOUS FISTULA (Left) as a surgical intervention .  The patient's history has been reviewed, patient examined, no change in status, stable for surgery.  I have reviewed the patient's chart and labs.  Questions were answered to the patient's satisfaction.     BRABHAM IV, V. WELLS

## 2012-10-13 NOTE — Anesthesia Procedure Notes (Signed)
Procedure Name: MAC Date/Time: 10/13/2012 3:25 PM Performed by: Leona Singleton A Pre-anesthesia Checklist: Patient identified, Emergency Drugs available, Suction available and Patient being monitored Patient Re-evaluated:Patient Re-evaluated prior to inductionOxygen Delivery Method: Simple face mask Preoxygenation: Pre-oxygenation with 100% oxygen Intubation Type: IV induction Dental Injury: Teeth and Oropharynx as per pre-operative assessment  Comments: Native airway throughout; VSS. Pt comfortable.

## 2012-10-13 NOTE — H&P (Cosign Needed)
[facility] [place]     Patient name: Brent Mcneil   MRN: [id number]        DOB: [birth year]          Sex: male     Referred by: Caryn Section   Reason for referral:   Chief Complaint   Patient presents with   .  Follow-up       dialysis M-W-F  needs permanent access        HISTORY OF PRESENT ILLNESS: Patient is an 79 year old gentleman with a long history of progressive renal insufficiency. He initiated hemodialysis in early April. He had a dialysis catheter placed in an outside facility and has had a good hemodialysis via the catheter. I do have a venous mapping done at an outlying hospital and had this for review. He has multiple other medical code the comorbid oh these and is gets around in a motorized scooter    Past Medical History   Diagnosis  Date   .  Chronic kidney disease         HD- M-W-F   .  End stage renal disease     .  Uremic encephalopathy         Metabolic   .  Anemia     .  Gastrointestinal bleed     .  Ulcer         Peptic and gastric    .  Ulcerative esophagitis     .  Atrial flutter     .  Myeloproliferative disorder     .  Macular degeneration  1990s       bilateral   .  History of thrombocytosis     .  Myocardial infarction  1996   .  Polio  age 71         Past Surgical History   Procedure  Laterality  Date   .  Colon resection    09-2006   .  Tonsillectomy    1958   .  Orthopedic surgeries    as a child       due to polio   .  Cataract surgery  Right  2004         History       Social History   .  Marital Status:  Married       Spouse Name:  N/A       Number of Children:  N/A   .  Years of Education:  N/A       Occupational History   .  Not on file.       Social History Main Topics   .  Smoking status:  Former Smoker       Quit date:  09/28/1956   .  Smokeless tobacco:  Not on file   .  Alcohol Use:  No   .  Drug Use:  No   .  Sexually Active:  Not on file       Other Topics   Concern   .  Not on file       Social History Narrative   .  No narrative on file        History reviewed. No pertinent family history.    Allergies as of 07/26/2012   .  (No Known Allergies)         Current Outpatient Prescriptions on File Prior to Visit  Medication  Sig  Dispense  Refill   .  acetaminophen (TYLENOL) 100 MG/ML solution  Take by mouth every 4 (four) hours as needed for fever. Takes 1 tablet 650 mg every 4 hours prn.         .  anagrelide (AGRYLIN) 0.5 MG capsule  Take 0.5 mg by mouth every other day.         Marland Kitchen  aspirin 81 MG tablet  Take 81 mg by mouth daily.         .  beta carotene 15 MG capsule  Take 15 mg by mouth daily.         .  cetirizine (ZYRTEC) 10 MG tablet  Take 10 mg by mouth daily.         Marland Kitchen  diltiazem (CARDIZEM) 30 MG tablet  Take 30 mg by mouth every 6 (six) hours.         .  febuxostat (ULORIC) 40 MG tablet  Take 40 mg by mouth daily.          .  pantoprazole (PROTONIX) 40 MG tablet  Take 40 mg by mouth daily.         .  tamsulosin (FLOMAX) 0.4 MG CAPS  Take by mouth.         .  sodium bicarbonate 650 MG tablet  Take 650 mg by mouth 2 (two) times daily.             No current facility-administered medications on file prior to visit.          REVIEW OF SYSTEMS:   Positives indicated with an "X"   CARDIOVASCULAR:  [ ]  chest pain   [ ]  chest pressure   [ ]  palpitations   [ ]  orthopnea               [ ]  dyspnea on exertion   [ ]  claudication   [ ]  rest pain   [ ]  DVT   [ ]  phlebitis PULMONARY:   [ ]  productive cough   [ ]  asthma   [ ]  wheezing NEUROLOGIC:   [ ]  weakness  [ ]  paresthesias  [ ]  aphasia  [ ]  amaurosis  [ ]  dizziness HEMATOLOGIC:   [ ]  bleeding problems   [x ] clotting disorders MUSCULOSKELETAL:  [ ]  joint pain   [ ]  joint swelling GASTROINTESTINAL: [ ]   blood in stool  [ ]   hematemesis GENITOURINARY:  [x ]  dysuria  [ ]   hematuria PSYCHIATRIC:  [ ]  history of major depression INTEGUMENTARY:  [ ]  rashes  [ ]   ulcers CONSTITUTIONAL:  [ ]  fever   [ ]  chills   PHYSICAL EXAMINATION:   General: The patient is a well-nourished male, in no acute distress. Vital signs are BP 123/69  Pulse 110  Resp 20  Ht 5\' 11"  (1.803 m)  Wt 256 lb 13.4 oz (116.5 kg)  BMI 35.84 kg/m2 Pulmonary: There is a good air exchange bilaterally   Abdomen: Soft and non-tender   Musculoskeletal: There are no major deformities.  There is no significant extremity pain. Neurologic: No focal weakness or paresthesias are detected, Skin: There are no ulcer or rashes noted. Psychiatric: The patient has normal affect. 2+ radial pulses bilaterally      Vascular Lab Studies:  Small Cephalic vein that dialtes at the elbow    Plan bracheocephalic fistula and possible ligation of radiocephalic fistula    Wells Brabham

## 2012-10-13 NOTE — H&P (View-Only) (Signed)
Here today for followup of AV fistula creation by Dr. Myra Gianotti on 08/18/2012. He is being dialyzed via a hemodialysis catheter. His daughter is with him today and reports that he has occasional poor flow but in general is doing well with the catheter. There has been no evidence of infection  On physical exam he is a well-healed incision in his left wrist with a thrill apparatus. The vein above this is relatively small in diameter.  Venous duplex today reveals a tight velocity at the arteriovenous anastomosis. In reviewing Dr. Estanislado Spire operative note he does report the patient had an atherosclerotic radial artery. Velocities are over 700 cm/s. The vein is of moderate size throughout the forearm. It is significantly larger at the antecubital space and more proximally.  Had a long discussion with the patient and his daughter present. I did not feel that he will obtain adequate size of his AV fistula is currently exists. I explained options of attempted salvage of this AV fistula with revision of the arterial anastomosis. Also explained potential angioplasty of this however filled this would be unlikely to correct the issue of the atherosclerotic radial artery experience at the time of surgery. I would recommend redo surgical revision of the current anastomosis for placement of a left upper arm AV fistula. My bias would be towards placement of a new upper arm fistula. He dialyzes on Monday Wednesday Fridays and therefore would require a Tuesday or Thursday surgery. We will schedule this with Dr. Myra Gianotti in for a final decision regarding revision of the Cimino fistula versus new upper arm fistula with Dr. Myra Gianotti

## 2012-10-14 ENCOUNTER — Telehealth: Payer: Self-pay | Admitting: Surgery

## 2012-10-14 ENCOUNTER — Other Ambulatory Visit: Payer: Self-pay | Admitting: *Deleted

## 2012-10-14 ENCOUNTER — Encounter (HOSPITAL_COMMUNITY): Payer: Self-pay | Admitting: Surgery

## 2012-10-14 DIAGNOSIS — Z4931 Encounter for adequacy testing for hemodialysis: Secondary | ICD-10-CM

## 2012-10-14 DIAGNOSIS — N186 End stage renal disease: Secondary | ICD-10-CM

## 2012-10-14 NOTE — Telephone Encounter (Addendum)
Message copied by Fredrich Birks on Fri Oct 14, 2012  3:46 PM ------      Message from: Melene Plan      Created: Fri Oct 14, 2012  9:42 AM                   ----- Message -----         From: Lars Mage, PA-C         Sent: 10/13/2012   4:40 PM           To: Melene Plan, RN            F/U in 6 weeks AV fistula creation left upper arm  Dr. Lear Ng ------  10/14/12: left message for patient regarding appt, dpm

## 2012-11-28 ENCOUNTER — Ambulatory Visit: Payer: Medicare Other | Admitting: Surgery

## 2012-12-05 ENCOUNTER — Encounter: Payer: Self-pay | Admitting: Vascular Surgery

## 2012-12-06 ENCOUNTER — Encounter: Payer: Self-pay | Admitting: Vascular Surgery

## 2012-12-06 ENCOUNTER — Encounter (INDEPENDENT_AMBULATORY_CARE_PROVIDER_SITE_OTHER): Payer: Medicare Other | Admitting: Vascular Surgery

## 2012-12-06 ENCOUNTER — Ambulatory Visit (INDEPENDENT_AMBULATORY_CARE_PROVIDER_SITE_OTHER): Payer: Self-pay | Admitting: Vascular Surgery

## 2012-12-06 VITALS — BP 121/49 | HR 89 | Ht 71.0 in | Wt 235.0 lb

## 2012-12-06 DIAGNOSIS — N184 Chronic kidney disease, stage 4 (severe): Secondary | ICD-10-CM

## 2012-12-06 DIAGNOSIS — N186 End stage renal disease: Secondary | ICD-10-CM

## 2012-12-06 DIAGNOSIS — Z4931 Encounter for adequacy testing for hemodialysis: Secondary | ICD-10-CM

## 2012-12-06 NOTE — Progress Notes (Signed)
The patient presents today for followup of a left upper arm brachiocephalic fistula with Dr. Myra Gianotti on 10/13/2012. He had had a wrist fistula by Dr. Myra Gianotti several months earlier which failed to mature. He's had no steal symptoms. He does sense that his left hand is cooler than his right hand but no eye discomfort. He does have some numbness over his thumb related to the incision from his cephalic radial fistula.  On physical exam the fistula has an excellent thrill and has good size maturation. He does run superficial under the skin and he does have some tortuosity. He also has multiple subcutaneous lipomas over his entire left arm. He did undergo duplex of the showing a good size with size ranging 6-7 mm in diameter throughout its course  Impression and plan excellent maturation of his left upper arm brachiocephalic fistula. I had a long discussion with the patient and his daughter present. Preferentially he would have 12 months of maturation. He is currently using a hemodialysis catheter is having good use of this. I. he would be possible to use this at 8 weeks should he have failure of his catheter. He will see [place] again on an as-needed basis

## 2013-02-08 ENCOUNTER — Inpatient Hospital Stay (HOSPITAL_COMMUNITY)
Admission: EM | Admit: 2013-02-08 | Discharge: 2013-02-11 | DRG: 871 | Disposition: A | Discharge status: Nursing Home (Includes ICF) | Payer: Medicare Other | Attending: Internal Medicine | Admitting: Internal Medicine

## 2013-02-08 ENCOUNTER — Emergency Department (HOSPITAL_COMMUNITY): Payer: Medicare Other

## 2013-02-08 DIAGNOSIS — Z8673 Personal history of transient ischemic attack (TIA), and cerebral infarction without residual deficits: Secondary | ICD-10-CM

## 2013-02-08 DIAGNOSIS — Z79899 Other long term (current) drug therapy: Secondary | ICD-10-CM

## 2013-02-08 DIAGNOSIS — Z87891 Personal history of nicotine dependence: Secondary | ICD-10-CM

## 2013-02-08 DIAGNOSIS — A419 Sepsis, unspecified organism: Principal | ICD-10-CM | POA: Diagnosis present

## 2013-02-08 DIAGNOSIS — I4891 Unspecified atrial fibrillation: Secondary | ICD-10-CM | POA: Diagnosis present

## 2013-02-08 DIAGNOSIS — D469 Myelodysplastic syndrome, unspecified: Secondary | ICD-10-CM | POA: Diagnosis present

## 2013-02-08 DIAGNOSIS — D473 Essential (hemorrhagic) thrombocythemia: Secondary | ICD-10-CM | POA: Diagnosis present

## 2013-02-08 DIAGNOSIS — Z8774 Personal history of (corrected) congenital malformations of heart and circulatory system: Secondary | ICD-10-CM

## 2013-02-08 DIAGNOSIS — I252 Old myocardial infarction: Secondary | ICD-10-CM

## 2013-02-08 DIAGNOSIS — N390 Urinary tract infection, site not specified: Secondary | ICD-10-CM | POA: Diagnosis present

## 2013-02-08 DIAGNOSIS — Z933 Colostomy status: Secondary | ICD-10-CM

## 2013-02-08 DIAGNOSIS — N186 End stage renal disease: Secondary | ICD-10-CM | POA: Diagnosis present

## 2013-02-08 DIAGNOSIS — Z992 Dependence on renal dialysis: Secondary | ICD-10-CM

## 2013-02-08 DIAGNOSIS — E876 Hypokalemia: Secondary | ICD-10-CM | POA: Diagnosis present

## 2013-02-08 DIAGNOSIS — Z7982 Long term (current) use of aspirin: Secondary | ICD-10-CM

## 2013-02-08 DIAGNOSIS — M899 Disorder of bone, unspecified: Secondary | ICD-10-CM | POA: Diagnosis present

## 2013-02-08 DIAGNOSIS — D649 Anemia, unspecified: Secondary | ICD-10-CM | POA: Diagnosis present

## 2013-02-08 DIAGNOSIS — N2581 Secondary hyperparathyroidism of renal origin: Secondary | ICD-10-CM | POA: Diagnosis present

## 2013-02-08 DIAGNOSIS — R6510 Systemic inflammatory response syndrome (SIRS) of non-infectious origin without acute organ dysfunction: Secondary | ICD-10-CM | POA: Diagnosis present

## 2013-02-08 LAB — URINALYSIS, ROUTINE W REFLEX MICROSCOPIC
Ketones, ur: 15 mg/dL — AB
Nitrite: POSITIVE — AB
Protein, ur: 100 mg/dL — AB
Urobilinogen, UA: 1 mg/dL (ref 0.0–1.0)

## 2013-02-08 LAB — CBC WITH DIFFERENTIAL/PLATELET
Basophils Absolute: 0 10*3/uL (ref 0.0–0.1)
Basophils Relative: 0 % (ref 0–1)
Eosinophils Relative: 0 % (ref 0–5)
HCT: 34.5 % — ABNORMAL LOW (ref 39.0–52.0)
MCHC: 32.8 g/dL (ref 30.0–36.0)
MCV: 101.8 fL — ABNORMAL HIGH (ref 78.0–100.0)
Monocytes Absolute: 0.8 10*3/uL (ref 0.1–1.0)
Neutro Abs: 10.3 10*3/uL — ABNORMAL HIGH (ref 1.7–7.7)
Platelets: 323 10*3/uL (ref 150–400)
RDW: 15.1 % (ref 11.5–15.5)

## 2013-02-08 LAB — URINE MICROSCOPIC-ADD ON

## 2013-02-08 LAB — BASIC METABOLIC PANEL
Calcium: 9.5 mg/dL (ref 8.4–10.5)
Creatinine, Ser: 2.38 mg/dL — ABNORMAL HIGH (ref 0.50–1.35)
GFR calc Af Amer: 27 mL/min — ABNORMAL LOW (ref >90)
GFR calc non Af Amer: 23 mL/min — ABNORMAL LOW (ref >90)

## 2013-02-08 MED ORDER — VANCOMYCIN HCL 10 G IV SOLR
1250.0000 mg | INTRAVENOUS | Status: AC
  Administered 2013-02-09: 1250 mg via INTRAVENOUS
  Filled 2013-02-08: qty 1250

## 2013-02-08 MED ORDER — SODIUM CHLORIDE 0.9 % IV BOLUS (SEPSIS)
1000.0000 mL | Freq: Once | INTRAVENOUS | Status: AC
  Administered 2013-02-08: 1000 mL via INTRAVENOUS

## 2013-02-08 NOTE — ED Provider Notes (Signed)
CSN: [id number]     Arrival date & time 02/08/13  1928 History   First MD Initiated Contact with Patient 02/08/13 1938     Chief Complaint  Patient presents with  . Weakness  . Fever   (Consider location/radiation/quality/duration/timing/severity/associated sxs/prior Treatment) HPI Comments: Patient is an 79 year old male with a past medical history of CKD on dialysis MWF, previous MI, atrial fibrillation, previous stroke who presents with generalized weakness and fever over the past 2 day. Symptoms started gradually and progressively worsened. Patient had 1 episode of vomiting last night. Patient was dialyzed today in [place] at [facility] and reports "feeling worse" after dialysis. Patient's family brought him here for further evaluation. Patient's fever was 98F at home according to the family. He denies any other symptoms. No aggravating/alleviating factors. Patient has a Perm-a-cath.    Past Medical History  Diagnosis Date  . Chronic kidney disease     HD- M-W-F  . End stage renal disease   . Uremic encephalopathy     Metabolic  . Anemia   . Gastrointestinal bleed   . Ulcer     Peptic and gastric   . Ulcerative esophagitis   . Myeloproliferative disorder   . Macular degeneration 1990s    bilateral  . History of thrombocytosis   . Myocardial infarction 1996  . Polio age 32  . Pneumonia   . Stroke     approx 6-7 years ago, affected speech for a short time  . Atrial flutter   . Atrial fibrillation 08/2012    treated with Carvedilol, Echo done 09/22/12   Past Surgical History  Procedure Laterality Date  . Colon resection  09-2006  . Tonsillectomy  1958  . Orthopedic surgeries  as a child    due to polio  . Cataract surgery Right 2004  . Colon surgery      with Colostomy  . Av fistula placement Left 08/18/2012    Procedure: ARTERIOVENOUS (AV) FISTULA CREATION;  Surgeon: Nada Libman, MD;  Location: [facility] OR;  Service: Vascular;  Laterality:  Left;  Marland Kitchen Eye surgery Right     cataract  . Cardiac catheterization  1996  . Av fistula placement Left 10/13/2012    Procedure: ARTERIOVENOUS (AV) FISTULA CREATION;  Surgeon: Nada Libman, MD;  Location: [facility] OR;  Service: Vascular;  Laterality: Left;   Family History  Problem Relation Age of Onset  . Cancer Father   . Lymphoma Brother   . Lung cancer Sister   . Emphysema Sister    History  Substance Use Topics  . Smoking status: Former Smoker    Quit date: 09/28/1956  . Smokeless tobacco: Former Neurosurgeon    Types: Chew  . Alcohol Use: No    Review of Systems  Constitutional: Positive for fever and fatigue. Negative for chills and diaphoresis.  HENT: Negative for congestion.   Eyes: Negative for visual disturbance.  Respiratory: Negative for cough and shortness of breath.   Cardiovascular: Negative for chest pain.  Gastrointestinal: Negative for nausea, vomiting, abdominal pain and diarrhea.  Musculoskeletal: Negative for arthralgias and back pain.    Allergies  Review of patient's allergies indicates no known allergies.  Home Medications   Current Outpatient Rx  Name  Route  Sig  Dispense  Refill  . anagrelide (AGRYLIN) 0.5 MG capsule   Oral   Take 0.5 mg by mouth every other day.         Marland Kitchen aspirin 81 MG tablet  Oral   Take 81 mg by mouth daily.         . beta carotene 16109 UNIT capsule   Oral   Take 25,000 Units by mouth daily.         . carvedilol (COREG) 3.125 MG tablet   Oral   Take 3.125 mg by mouth 2 (two) times daily with a meal.         . cetirizine (ZYRTEC) 10 MG tablet   Oral   Take 10 mg by mouth daily as needed for allergies.          . cinacalcet (SENSIPAR) 30 MG tablet   Oral   Take 30 mg by mouth daily with supper.          . diltiazem (CARDIZEM) 30 MG tablet   Oral   Take 30 mg by mouth See admin instructions. Take 30mg  every 6 hours (at 6 AM, 12 PM, 6 PM, and 12 AM).  Hold 6 AM dose on Monday, Wednesday, Friday.  Hold for  SBP<120         . febuxostat (ULORIC) 40 MG tablet   Oral   Take 40 mg by mouth daily.          . multivitamin (RENA-VIT) TABS tablet   Oral   Take 1 tablet by mouth daily.         Marland Kitchen oxyCODONE (OXY IR/ROXICODONE) 5 MG immediate release tablet   Oral   Take 1 tablet (5 mg total) by mouth every 6 (six) hours as needed for pain.   30 tablet   0   . pantoprazole (PROTONIX) 40 MG tablet   Oral   Take 40 mg by mouth daily.         . sevelamer carbonate (RENVELA) 800 MG tablet   Oral   Take 1,600 mg by mouth 3 (three) times daily with meals.         . tamsulosin (FLOMAX) 0.4 MG CAPS   Oral   Take 0.4 mg by mouth daily after supper.          . traMADol (ULTRAM) 50 MG tablet   Oral   Take 50 mg by mouth every 6 (six) hours as needed for pain.          BP 110/40  Pulse 111  Temp(Src) 98.8 F (37.1 C) (Oral)  Resp 16  SpO2 97% Physical Exam  Nursing note and vitals reviewed. Constitutional: He is oriented to person, place, and time. He appears well-developed and well-nourished. No distress.  HENT:  Head: Normocephalic and atraumatic.  Eyes: Conjunctivae and EOM are normal.  Neck: Normal range of motion.  Cardiovascular: Normal rate and regular rhythm.  Exam reveals no gallop and no friction rub.   No murmur heard. Pulmonary/Chest: Effort normal and breath sounds normal. He has no wheezes. He has no rales. He exhibits no tenderness.  Abdominal: Soft. He exhibits no distension. There is no tenderness. There is no rebound and no guarding.  Musculoskeletal: Normal range of motion.  Neurological: He is alert and oriented to person, place, and time. Coordination normal.  Speech is goal-oriented. Moves limbs without ataxia.   Skin: Skin is warm and dry.  Psychiatric: He has a normal mood and affect. His behavior is normal.    ED Course  Procedures (including critical care time)   Date: 02/08/2013  Rate: 107  Rhythm: atrial fibrillation  QRS Axis: normal   Intervals: QT prolonged  ST/T Wave abnormalities: normal  Conduction  Disutrbances:nonspecific intraventricular conduction delay  Narrative Interpretation: afib unchanged from previous  Old EKG Reviewed: unchanged    Labs Review Labs Reviewed  CBC WITH DIFFERENTIAL - Abnormal; Notable for the following:    WBC 11.5 (*)    RBC 3.39 (*)    Hemoglobin 11.3 (*)    HCT 34.5 (*)    MCV 101.8 (*)    Neutrophils Relative % 89 (*)    Neutro Abs 10.3 (*)    Lymphocytes Relative 3 (*)    Lymphs Abs 0.4 (*)    All other components within normal limits  BASIC METABOLIC PANEL - Abnormal; Notable for the following:    Potassium 3.0 (*)    Glucose, Bld 117 (*)    Creatinine, Ser 2.38 (*)    GFR calc non Af Amer 23 (*)    GFR calc Af Amer 27 (*)    All other components within normal limits  URINALYSIS, ROUTINE W REFLEX MICROSCOPIC - Abnormal; Notable for the following:    Color, Urine RED (*)    APPearance TURBID (*)    Bilirubin Urine MODERATE (*)    Ketones, ur 15 (*)    Protein, ur 100 (*)    Nitrite POSITIVE (*)    Leukocytes, UA MODERATE (*)    All other components within normal limits  URINE MICROSCOPIC-ADD ON - Abnormal; Notable for the following:    Squamous Epithelial / LPF FEW (*)    Bacteria, UA MANY (*)    All other components within normal limits  URINE CULTURE   Imaging Review Dg Chest 2 View  02/08/2013   CLINICAL DATA:  Fever, weakness  EXAM: CHEST  2 VIEW  COMPARISON:  08/18/2012  FINDINGS: Cardiomegaly. Dual lumen right IJ catheter is unchanged in position. No acute infiltrate or pleural effusion. No pulmonary edema. Bony thorax is stable.  IMPRESSION: No active cardiopulmonary disease.   Electronically Signed   By: Natasha Mead M.D.   On: 02/08/2013 20:11    EKG Interpretation   None       MDM   1. Atrial fibrillation with RVR   2. UTI (urinary tract infection)     8:58 PM Labs pending. Chest xray unremarkable for acute changes. Patient is slightly  hypotensive. Patient will have fluids. Patient also tachycardic. Patient is currently afebrile.   11:24 PM Patient's BP improving with fluids. Patient has a slightly elevated WBC at 11.5. Urinalysis shows UTI. Patient will have IV Vancomycin. I will consult Nephrology for further guidance.   11:35 PM I spoke with Nephrology who states the patient does not need Nephrology intervention at this time. I will order blood cultures and admit the patient for oxygen dependence.   12:43 AM Patient started on Cardizem for Afib with RVR. Patient will have Cipro for UTI, per Dr. Toniann Fail request. Blood cultures ordered. Patient will be admitted.   Emilia Beck, PA-C 02/09/13 [phone]

## 2013-02-08 NOTE — ED Notes (Signed)
PER PTAR: pt from [facility] in [place], [place]. Pt is a dialysis patient and had today and started running a fever 99.9 and stated he was weak, been sleeping a lot. Alert and oriented. PTAR reports O2 decreased to 89% RA and was placed on 2L [place]. VS: BP-98/50, HR-102, 89% RA and increased 94%. Fistula on left arm and PICC line on right chest.

## 2013-02-09 ENCOUNTER — Inpatient Hospital Stay (HOSPITAL_COMMUNITY): Payer: Medicare Other

## 2013-02-09 ENCOUNTER — Encounter (HOSPITAL_COMMUNITY): Payer: Self-pay | Admitting: Internal Medicine

## 2013-02-09 DIAGNOSIS — N390 Urinary tract infection, site not specified: Secondary | ICD-10-CM

## 2013-02-09 DIAGNOSIS — D469 Myelodysplastic syndrome, unspecified: Secondary | ICD-10-CM | POA: Diagnosis present

## 2013-02-09 DIAGNOSIS — N186 End stage renal disease: Secondary | ICD-10-CM | POA: Diagnosis present

## 2013-02-09 DIAGNOSIS — I4891 Unspecified atrial fibrillation: Secondary | ICD-10-CM

## 2013-02-09 DIAGNOSIS — R6510 Systemic inflammatory response syndrome (SIRS) of non-infectious origin without acute organ dysfunction: Secondary | ICD-10-CM | POA: Diagnosis present

## 2013-02-09 DIAGNOSIS — Z8774 Personal history of (corrected) congenital malformations of heart and circulatory system: Secondary | ICD-10-CM

## 2013-02-09 LAB — CBC WITH DIFFERENTIAL/PLATELET
Basophils Absolute: 0 10*3/uL (ref 0.0–0.1)
Basophils Relative: 0 % (ref 0–1)
HCT: 31.4 % — ABNORMAL LOW (ref 39.0–52.0)
Lymphocytes Relative: 4 % — ABNORMAL LOW (ref 12–46)
MCHC: 32.5 g/dL (ref 30.0–36.0)
Neutro Abs: 6.5 10*3/uL (ref 1.7–7.7)
Platelets: 285 10*3/uL (ref 150–400)
RBC: 3.06 MIL/uL — ABNORMAL LOW (ref 4.22–5.81)
RDW: 15.2 % (ref 11.5–15.5)
WBC: 7.6 10*3/uL (ref 4.0–10.5)

## 2013-02-09 LAB — COMPREHENSIVE METABOLIC PANEL
ALT: 25 U/L (ref 0–53)
AST: 45 U/L — ABNORMAL HIGH (ref 0–37)
Alkaline Phosphatase: 146 U/L — ABNORMAL HIGH (ref 39–117)
BUN: 22 mg/dL (ref 6–23)
CO2: 29 mEq/L (ref 19–32)
Calcium: 9.3 mg/dL (ref 8.4–10.5)
Chloride: 96 mEq/L (ref 96–112)
GFR calc Af Amer: 22 mL/min — ABNORMAL LOW (ref >90)
GFR calc non Af Amer: 19 mL/min — ABNORMAL LOW (ref >90)
Glucose, Bld: 124 mg/dL — ABNORMAL HIGH (ref 70–99)
Potassium: 3.2 mEq/L — ABNORMAL LOW (ref 3.5–5.1)
Total Bilirubin: 3 mg/dL — ABNORMAL HIGH (ref 0.3–1.2)

## 2013-02-09 LAB — HEPATIC FUNCTION PANEL
AST: 51 U/L — ABNORMAL HIGH (ref 0–37)
Bilirubin, Direct: 3 mg/dL — ABNORMAL HIGH (ref 0.0–0.3)

## 2013-02-09 LAB — GLUCOSE, CAPILLARY
Glucose-Capillary: 88 mg/dL (ref 70–99)
Glucose-Capillary: 102 mg/dL — ABNORMAL HIGH (ref 70–99)

## 2013-02-09 LAB — MRSA PCR SCREENING: MRSA by PCR: NEGATIVE

## 2013-02-09 LAB — T4, FREE: Free T4: 1.15 ng/dL (ref 0.80–1.80)

## 2013-02-09 LAB — LIPASE, BLOOD: Lipase: 22 U/L (ref 11–59)

## 2013-02-09 MED ORDER — SODIUM CHLORIDE 0.9 % IJ SOLN: 3.0000 mL | Freq: Two times a day (BID) | INTRAMUSCULAR | Status: DC

## 2013-02-09 MED ORDER — SODIUM CHLORIDE 0.9 % IJ SOLN
3.0000 mL | Freq: Two times a day (BID) | INTRAMUSCULAR | Status: DC
  Administered 2013-02-09 – 2013-02-10 (×3): 3 mL via INTRAVENOUS
  Administered 2013-02-10: 10:00:00 via INTRAVENOUS
  Administered 2013-02-11: 3 mL via INTRAVENOUS

## 2013-02-09 MED ORDER — CARVEDILOL 3.125 MG PO TABS
3.1250 mg | ORAL_TABLET | Freq: Two times a day (BID) | ORAL | Status: DC
Start: 2013-02-09 — End: 2013-02-11
  Administered 2013-02-09 – 2013-02-11 (×4): 3.125 mg via ORAL
  Filled 2013-02-09 (×7): qty 1

## 2013-02-09 MED ORDER — IOHEXOL 300 MG/ML  SOLN
25.0000 mL | INTRAMUSCULAR | Status: AC
  Administered 2013-02-09 (×2): 25 mL via ORAL

## 2013-02-09 MED ORDER — DILTIAZEM HCL 25 MG/5ML IV SOLN
5.0000 mg | Freq: Once | INTRAVENOUS | Status: AC
  Administered 2013-02-09: 5 mg via INTRAVENOUS
  Filled 2013-02-09: qty 5

## 2013-02-09 MED ORDER — GUAIFENESIN 100 MG/5ML PO SYRP
200.0000 mg | ORAL_SOLUTION | ORAL | Status: DC | PRN
  Filled 2013-02-09: qty 10

## 2013-02-09 MED ORDER — ACETAMINOPHEN 650 MG RE SUPP: 650.0000 mg | Freq: Four times a day (QID) | RECTAL | Status: DC | PRN

## 2013-02-09 MED ORDER — SEVELAMER CARBONATE 800 MG PO TABS
1600.0000 mg | ORAL_TABLET | Freq: Three times a day (TID) | ORAL | Status: DC
  Administered 2013-02-09 – 2013-02-11 (×5): 1600 mg via ORAL
  Filled 2013-02-09 (×10): qty 2

## 2013-02-09 MED ORDER — PANTOPRAZOLE SODIUM 40 MG PO TBEC
40.0000 mg | DELAYED_RELEASE_TABLET | Freq: Every day | ORAL | Status: DC
Start: 2013-02-09 — End: 2013-02-11
  Administered 2013-02-09 – 2013-02-11 (×3): 40 mg via ORAL
  Filled 2013-02-09 (×3): qty 1

## 2013-02-09 MED ORDER — ONDANSETRON HCL 4 MG/2ML IJ SOLN: 4.0000 mg | Freq: Three times a day (TID) | INTRAMUSCULAR | Status: DC | PRN

## 2013-02-09 MED ORDER — CIPROFLOXACIN IN D5W 400 MG/200ML IV SOLN
400.0000 mg | Freq: Once | INTRAVENOUS | Status: DC
  Filled 2013-02-09: qty 200

## 2013-02-09 MED ORDER — CIPROFLOXACIN IN D5W 400 MG/200ML IV SOLN
400.0000 mg | INTRAVENOUS | Status: DC
  Administered 2013-02-09 – 2013-02-11 (×3): 400 mg via INTRAVENOUS
  Filled 2013-02-09 (×3): qty 200

## 2013-02-09 MED ORDER — ANAGRELIDE HCL 0.5 MG PO CAPS
0.5000 mg | ORAL_CAPSULE | ORAL | Status: DC
Start: 2013-02-09 — End: 2013-02-11
  Administered 2013-02-09 – 2013-02-11 (×2): 0.5 mg via ORAL
  Filled 2013-02-09 (×2): qty 1

## 2013-02-09 MED ORDER — CIPROFLOXACIN IN D5W 400 MG/200ML IV SOLN: 400.0000 mg | INTRAVENOUS | Status: DC

## 2013-02-09 MED ORDER — RENA-VITE PO TABS
1.0000 | ORAL_TABLET | Freq: Every day | ORAL | Status: DC
  Administered 2013-02-09 – 2013-02-10 (×2): 1 via ORAL
  Filled 2013-02-09 (×3): qty 1

## 2013-02-09 MED ORDER — CINACALCET HCL 30 MG PO TABS
30.0000 mg | ORAL_TABLET | Freq: Every day | ORAL | Status: DC
  Administered 2013-02-09 – 2013-02-10 (×2): 30 mg via ORAL
  Filled 2013-02-09 (×3): qty 1

## 2013-02-09 MED ORDER — ONDANSETRON HCL 4 MG PO TABS: 4.0000 mg | ORAL_TABLET | Freq: Four times a day (QID) | ORAL | Status: DC | PRN

## 2013-02-09 MED ORDER — FEBUXOSTAT 40 MG PO TABS
40.0000 mg | ORAL_TABLET | Freq: Every day | ORAL | Status: DC
Start: 2013-02-09 — End: 2013-02-11
  Administered 2013-02-09 – 2013-02-11 (×3): 40 mg via ORAL
  Filled 2013-02-09 (×3): qty 1

## 2013-02-09 MED ORDER — VANCOMYCIN HCL IN DEXTROSE 1-5 GM/200ML-% IV SOLN
1000.0000 mg | INTRAVENOUS | Status: DC
  Administered 2013-02-11: 1000 mg via INTRAVENOUS
  Filled 2013-02-09 (×2): qty 200

## 2013-02-09 MED ORDER — TAMSULOSIN HCL 0.4 MG PO CAPS
0.4000 mg | ORAL_CAPSULE | Freq: Every day | ORAL | Status: DC
  Administered 2013-02-09 – 2013-02-10 (×2): 0.4 mg via ORAL
  Filled 2013-02-09 (×3): qty 1

## 2013-02-09 MED ORDER — IOHEXOL 300 MG/ML  SOLN
100.0000 mL | Freq: Once | INTRAMUSCULAR | Status: AC | PRN
  Administered 2013-02-09: 100 mL via INTRAVENOUS

## 2013-02-09 MED ORDER — ACETAMINOPHEN 325 MG PO TABS: 650.0000 mg | ORAL_TABLET | Freq: Four times a day (QID) | ORAL | Status: DC | PRN

## 2013-02-09 MED ORDER — LORATADINE 10 MG PO TABS
10.0000 mg | ORAL_TABLET | Freq: Every day | ORAL | Status: DC
  Administered 2013-02-09 – 2013-02-11 (×3): 10 mg via ORAL
  Filled 2013-02-09 (×3): qty 1

## 2013-02-09 MED ORDER — VANCOMYCIN HCL IN DEXTROSE 1-5 GM/200ML-% IV SOLN
1000.0000 mg | Freq: Once | INTRAVENOUS | Status: AC
  Administered 2013-02-09: 1000 mg via INTRAVENOUS
  Filled 2013-02-09: qty 200

## 2013-02-09 MED ORDER — BETA CAROTENE 25000 UNITS PO CAPS: 25000.0000 [IU] | ORAL_CAPSULE | Freq: Every day | ORAL | Status: DC

## 2013-02-09 MED ORDER — DILTIAZEM HCL ER COATED BEADS 180 MG PO CP24
180.0000 mg | ORAL_CAPSULE | Freq: Every day | ORAL | Status: DC
  Administered 2013-02-09 – 2013-02-11 (×3): 180 mg via ORAL
  Filled 2013-02-09 (×3): qty 1

## 2013-02-09 MED ORDER — DILTIAZEM HCL 30 MG PO TABS
30.0000 mg | ORAL_TABLET | Freq: Four times a day (QID) | ORAL | Status: DC
  Administered 2013-02-09: 30 mg via ORAL
  Filled 2013-02-09 (×5): qty 1

## 2013-02-09 MED ORDER — PROSIGHT PO TABS
1.0000 | ORAL_TABLET | Freq: Every day | ORAL | Status: DC
  Administered 2013-02-09 – 2013-02-11 (×3): 1 via ORAL
  Filled 2013-02-09 (×3): qty 1

## 2013-02-09 MED ORDER — ONDANSETRON HCL 4 MG/2ML IJ SOLN: 4.0000 mg | Freq: Four times a day (QID) | INTRAMUSCULAR | Status: DC | PRN

## 2013-02-09 NOTE — H&P (Addendum)
[facility] History and Physical  Brent Mcneil:[id number] DOB: 1924/01/03 DOA: 02/08/2013  Referring physician: ER physician. PCP: Galvin Proffer, MD  Specialists: Nephrologist for dialysis.  Chief Complaint: Fever.  HPI: PAO Brent Mcneil is a 79 y.o. male with history of ESRD on hemodialysis, atrial fibrillation not on Coumadin secondary to AVMs, myeloproliferative disorder/thrombocytosis on anagrelide was advised to come to the ER after patient was found to be febrile. Patient has been not feeling well last 2 days. Yesterday patient had one episode of nausea vomiting and has not been eating well since yesterday. Patient denies any chest pain shortness of breath abdominal pain diarrhea. Yesterday patient was found to be mildly febrile after dialysis following which patient was instructed to come to the ER. In the ER patient was initially mildly hypotensive for which ER physician had given 1 L normal saline bolus. Chest x-ray shows congestion. UA shows features of UTI. Patient has been admitted for further management. Patient's heart rate has gone up. One dose of Cardizem was given bolus but blood pressure is in the low-normal.  Review  of Systems: As presented in the history of presenting illness, rest negative.  Past Medical History  Diagnosis Date  . Chronic kidney disease     HD- M-W-F  . End stage renal disease   . Uremic encephalopathy     Metabolic  . Anemia   . Gastrointestinal bleed   . Ulcer     Peptic and gastric   . Ulcerative esophagitis   . Myeloproliferative disorder   . Macular degeneration 1990s    bilateral  . History of thrombocytosis   . Myocardial infarction 1996  . Polio age 56  . Pneumonia   . Stroke     approx 6-7 years ago, affected speech for a short time  . Atrial flutter   . Atrial fibrillation 08/2012    treated with Carvedilol, Echo done 09/22/12   Past Surgical History  Procedure Laterality Date  . Colon resection  09-2006  .  Tonsillectomy  1958  . Orthopedic surgeries  as a child    due to polio  . Cataract surgery Right 2004  . Colon surgery      with Colostomy  . Av fistula placement Left 08/18/2012    Procedure: ARTERIOVENOUS (AV) FISTULA CREATION;  Surgeon: Nada Libman, MD;  Location: [facility] OR;  Service: Vascular;  Laterality: Left;  Marland Kitchen Eye surgery Right     cataract  . Cardiac catheterization  1996  . Av fistula placement Left 10/13/2012    Procedure: ARTERIOVENOUS (AV) FISTULA CREATION;  Surgeon: Nada Libman, MD;  Location: [facility] OR;  Service: Vascular;  Laterality: Left;   Social History:  reports that he quit smoking about 56 years ago. He has quit using smokeless tobacco. His smokeless tobacco use included Chew. He reports that he does not drink alcohol or use illicit drugs. Where does patient live  home. Can patient participate in ADLs? not sure.   No Known Allergies  Family History:  Family History  Problem Relation Age of Onset  . Cancer Father   . Lymphoma Brother   . Lung cancer Sister   . Emphysema Sister       Prior to Admission medications   Medication Sig Start Date End Date Taking? Authorizing Provider  anagrelide (AGRYLIN) 0.5 MG capsule Take 0.5 mg by mouth every other day.   Yes Historical Provider, MD  aspirin 81 MG tablet Take 81 mg by mouth daily.  Yes Historical Provider, MD  beta carotene 16109 UNIT capsule Take 25,000 Units by mouth daily.   Yes Historical Provider, MD  carvedilol (COREG) 3.125 MG tablet Take 3.125 mg by mouth 2 (two) times daily with a meal. Hold for SBP <120 Hold 8AM dose on Mondays Wednesdays and fridays. Last taken on 01-29-13   Yes Historical Provider, MD  cetirizine (ZYRTEC) 10 MG tablet Take 10 mg by mouth daily as needed for allergies.    Yes Historical Provider, MD  cinacalcet (SENSIPAR) 30 MG tablet Take 30 mg by mouth daily with supper.    Yes Historical Provider, MD  diltiazem (CARDIZEM) 30 MG tablet Take 30 mg by mouth See admin instructions.  Take 30mg  every 6 hours (at 6 AM, 12 PM, 6 PM, and 12 AM).  Hold 6 AM dose on Monday, Wednesday, Friday.  Hold for SBP<120   Yes Historical Provider, MD  febuxostat (ULORIC) 40 MG tablet Take 40 mg by mouth daily.    Yes Historical Provider, MD  guaifenesin (ROBITUSSIN) 100 MG/5ML syrup Take 200 mg by mouth every 4 (four) hours as needed for congestion.   Yes Historical Provider, MD  multivitamin (RENA-VIT) TABS tablet Take 1 tablet by mouth daily.   Yes Historical Provider, MD  pantoprazole (PROTONIX) 40 MG tablet Take 40 mg by mouth daily.   Yes Historical Provider, MD  sevelamer carbonate (RENVELA) 800 MG tablet Take 1,600 mg by mouth 3 (three) times daily with meals.   Yes Historical Provider, MD  tamsulosin (FLOMAX) 0.4 MG CAPS Take 0.4 mg by mouth daily after supper.    Yes Historical Provider, MD  traMADol (ULTRAM) 50 MG tablet Take 50 mg by mouth every 6 (six) hours as needed for pain.   Yes Historical Provider, MD    Physical Exam: Filed Vitals:   02/08/13 2315 02/09/13 0000 02/09/13 0100 02/09/13 0115  BP: 102/40 110/47 95/45 99/49   Pulse: 112 121 112 106  Temp:      TempSrc:      Resp: 23 23 23 25   SpO2: 97% 97% 94% 91%     General:   well-developed and nourished.   Eyes: Anicteric no pallor.   ENT: No discharge from ears eyes nose mouth.  Neck: No mass felt.   Cardiovascular:  S1-S2 heard.   Respiratory:  no rhonchi or crepitations.   Abdomen: Soft nontender bowel sounds present. No guarding or rigidity.   Skin: No rash.   Musculoskeletal:  no edema. Right upper extremity movements decreased because of right shoulder arthritis.   Psychiatric:  appears normal.   Neurologic:  patient is sleepy but easily arousable and follows commands. Does not move right lower extremity due to previous history of polio.   Labs on Admission:  Basic Metabolic Panel:  Recent Labs Lab 02/08/13 1939  NA 137  K 3.0*  CL 96  CO2 32  GLUCOSE 117*  BUN 17  CREATININE 2.38*   CALCIUM 9.5   Liver Function Tests:  Recent Labs Lab 02/08/13 1939  AST 51*  ALT 30  ALKPHOS 164*  BILITOT 3.7*  PROT 6.6  ALBUMIN 2.7*   No results found for this basename: LIPASE, AMYLASE,  in the last 168 hours No results found for this basename: AMMONIA,  in the last 168 hours CBC:  Recent Labs Lab 02/08/13 1939  WBC 11.5*  NEUTROABS 10.3*  HGB 11.3*  HCT 34.5*  MCV 101.8*  PLT 323   Cardiac Enzymes: No results found for this basename: CKTOTAL,  CKMB, CKMBINDEX, TROPONINI,  in the last 168 hours  BNP (last 3 results) No results found for this basename: PROBNP,  in the last 8760 hours CBG: No results found for this basename: GLUCAP,  in the last 168 hours  Radiological Exams on Admission: Dg Chest 2 View  02/08/2013   CLINICAL DATA:  Fever, weakness  EXAM: CHEST  2 VIEW  COMPARISON:  08/18/2012  FINDINGS: Cardiomegaly. Dual lumen right IJ catheter is unchanged in position. No acute infiltrate or pleural effusion. No pulmonary edema. Bony thorax is stable.  IMPRESSION: No active cardiopulmonary disease.   Electronically Signed   By: Natasha Mead M.D.   On: 02/08/2013 20:11    EKG: Independently reviewed.  A. fib with RVR.   Assessment/Plan Principal Problem:   SIRS (systemic inflammatory response syndrome) Active Problems:   Atrial fibrillation with RVR   ESRD (end stage renal disease)   History of arteriovenous malformation (AVM)   MDS (myelodysplastic syndrome)   1. SIRS - source not clear. Could be intra-abdominal/UTI or line sepsis from dialysis catheter. Since bilirubin is high will check CT Abdomen and Pelvis for any gallbladder pathology. The dialysis catheter externally does not show any signs of infection. Continue with empiric antibiotics. Follow cultures. Follow LFT'S. Keep patient NPO until CT abdomen and pelvis results available. Check Lipase. 2. A. fib with RVR - closely monitor in step down. Based on patient's blood pressure we may have to decide  if patient needs to be on Cardizem infusion of amiodarone if patient's heart rate was further. A. fib probably precipitated by #1. Check thyroid function test. Patient not on anticoagulants because of AVMs. 3. ESRD on hemodialysis on Monday Wednesday and Friday - consult nephrologist for dialysis. 4. Myeloproliferative disorder/thrombocytosis - on anagrelide. 5. History of post polio syndrome.    Code Status: Full code.  Family Communication: Patient's family at the bedside.  Disposition Plan: Admit to inpatient.    Jax Kentner N. [facility] Pager [phone].  If 7PM-7AM, please contact night-coverage [email or web address] Password [facility] 02/09/2013, 2:38 AM

## 2013-02-09 NOTE — Progress Notes (Addendum)
[facility] Progress Note [place] TEAM 1 - Stepdown/ICU TEAM   JANET DECESARE ZOX:[id number] DOB: Jan 18, 1924 DOA: 02/08/2013 PCP: Galvin Proffer, MD  Brief narrative: This is a 79 year old male with past medical history of end-stage renal disease on hemodialysis, atrial fibrillation not on Coumadin due to AVMs, myeloproliferative disorder on an anagrelide who came to the ER because of a fever. She also complained of vomiting poor appetite and overall not feeling well for the past 2 days. UA revealed a urinary tract infection. She also had rapid ventricular response and was treated with Cardizem.  Assessment/Plan: Principal Problem:  Sepsis -Due to UTI-CT of the abdomen does not reveal any other clear cut etiology for sepsis -WBC count improving and fevers resolved for now -Followup on cultures  Active Problems:   Atrial fibrillation with RVR -Currently rate controlled-not a candidate for Coumadin do to AVM    ESRD (end stage renal disease) -will notify Nephrology --he had last hemodialysis yesterday    History of arteriovenous malformation (AVM)    MDS (myelodysplastic syndrome) -Continue anagrelide  Colostomy  Hypokalemia -Management per nephrology    Code Status: Full code Family Communication: None Disposition Plan: Transfer to telemetry  Consultants: Nepro  Procedures: None  Antibiotics: Vancomycin and ciprofloxacin 11/26  DVT prophylaxis: SCDs  HPI/Subjective: Patient alert. Has no complaints.   Objective: Blood pressure 113/47, pulse 101, temperature 99.8 F (37.7 C), temperature source Oral, resp. rate 22, height 5\' 11"  (1.803 m), weight 96.6 kg (212 lb 15.4 oz), SpO2 95.00%.  Intake/Output Summary (Last 24 hours) at 02/09/13 1451 Last data filed at 02/09/13 1145  Gross per 24 hour  Intake   1120 ml  Output    350 ml  Net    770 ml     Exam: General: No acute respiratory distress Lungs: Clear to auscultation bilaterally  without wheezes or crackles Cardiovascular: Irregular rate and rhythm without murmur gallop or rub normal S1 and S2 Abdomen: Nontender, nondistended, soft, bowel sounds positive, no rebound, no ascites, no appreciable mass Extremities: No significant cyanosis, clubbing, or edema bilateral lower extremities  Data Reviewed: Basic Metabolic Panel:  Recent Labs Lab 02/08/13 1939 02/09/13 0500  NA 137 136  K 3.0* 3.2*  CL 96 96  CO2 32 29  GLUCOSE 117* 124*  BUN 17 22  CREATININE 2.38* 2.78*  CALCIUM 9.5 9.3   Liver Function Tests:  Recent Labs Lab 02/08/13 1939 02/09/13 0500  AST 51* 45*  ALT 30 25  ALKPHOS 164* 146*  BILITOT 3.7* 3.0*  PROT 6.6 6.0  ALBUMIN 2.7* 2.4*    Recent Labs Lab 02/09/13 0715  LIPASE 22   No results found for this basename: AMMONIA,  in the last 168 hours CBC:  Recent Labs Lab 02/08/13 1939 02/09/13 0500  WBC 11.5* 7.6  NEUTROABS 10.3* 6.5  HGB 11.3* 10.2*  HCT 34.5* 31.4*  MCV 101.8* 102.6*  PLT 323 285   Cardiac Enzymes: No results found for this basename: CKTOTAL, CKMB, CKMBINDEX, TROPONINI,  in the last 168 hours BNP (last 3 results) No results found for this basename: PROBNP,  in the last 8760 hours CBG:  Recent Labs Lab 02/09/13 1211  GLUCAP 112*    Recent Results (from the past 240 hour(s))  MRSA PCR SCREENING     Status: None   Collection Time    02/09/13  3:32 AM      Result Value Range Status   MRSA by PCR NEGATIVE  NEGATIVE Final  Comment:            The GeneXpert MRSA Assay (FDA     approved for NASAL specimens     only), is one component of a     comprehensive MRSA colonization     surveillance program. It is not     intended to diagnose MRSA     infection nor to guide or     monitor treatment for     MRSA infections.     Studies:  Recent x-ray studies have been reviewed in detail by the Attending Physician  Scheduled Meds:  Scheduled Meds: . anagrelide  0.5 mg Oral QODAY  . carvedilol   3.125 mg Oral BID WC  . cinacalcet  30 mg Oral Q supper  . ciprofloxacin  400 mg Intravenous Once  . ciprofloxacin  400 mg Intravenous Q24H  . diltiazem  180 mg Oral Daily  . febuxostat  40 mg Oral Daily  . loratadine  10 mg Oral Daily  . multivitamin  1 tablet Oral Daily  . multivitamin  1 tablet Oral QHS  . pantoprazole  40 mg Oral Daily  . sevelamer carbonate  1,600 mg Oral TID WC  . sodium chloride  3 mL Intravenous Q12H  . sodium chloride  3 mL Intravenous Q12H  . tamsulosin  0.4 mg Oral QPC supper  . [START ON 02/10/2013] vancomycin  1,000 mg Intravenous Q M,W,F-HD   Continuous Infusions:   Time spent on care of this patient: 35 minute   Calvert Cantor, MD  [facility] Office  [phone] Pager - Text Page per Loretha Stapler as per below:  On-Call/Text Page:      [email or web address]      password [email or web address]  If 7PM-7AM, please contact night-coverage [email or web address] Password [email or web address] 02/09/2013, 2:51 PM   LOS: 1 day

## 2013-02-09 NOTE — ED Notes (Signed)
Dr. Kakrakandy at bedside. 

## 2013-02-09 NOTE — Progress Notes (Signed)
ANTIBIOTIC CONSULT NOTE - INITIAL  Pharmacy Consult for Vancocin and Cipro Indication: rule out sepsis and UTI  No Known Allergies  Patient Measurements: Weight: 106kg  Vital Signs: Temp: 98.8 F (37.1 C) (11/26 1948) Temp src: Oral (11/26 1948) BP: 108/44 mmHg (11/27 0300) Pulse Rate: 114 (11/27 0300)  Labs:  Recent Labs  02/08/13 1939  WBC 11.5*  HGB 11.3*  PLT 323  CREATININE 2.38*    Microbiology: No results found for this or any previous visit (from the past 720 hour(s)).  Medical History: Past Medical History  Diagnosis Date  . Chronic kidney disease     HD- M-W-F  . End stage renal disease   . Uremic encephalopathy     Metabolic  . Anemia   . Gastrointestinal bleed   . Ulcer     Peptic and gastric   . Ulcerative esophagitis   . Myeloproliferative disorder   . Macular degeneration 1990s    bilateral  . History of thrombocytosis   . Myocardial infarction 1996  . Polio age 22  . Pneumonia   . Stroke     approx 6-7 years ago, affected speech for a short time  . Atrial flutter   . Atrial fibrillation 08/2012    treated with Carvedilol, Echo done 09/22/12    Medications:  Prescriptions prior to admission  Medication Sig Dispense Refill  . anagrelide (AGRYLIN) 0.5 MG capsule Take 0.5 mg by mouth every other day.      Marland Kitchen aspirin 81 MG tablet Take 81 mg by mouth daily.      . beta carotene 11914 UNIT capsule Take 25,000 Units by mouth daily.      . carvedilol (COREG) 3.125 MG tablet Take 3.125 mg by mouth 2 (two) times daily with a meal. Hold for SBP <120 Hold 8AM dose on Mondays Wednesdays and fridays. Last taken on 01-29-13      . cetirizine (ZYRTEC) 10 MG tablet Take 10 mg by mouth daily as needed for allergies.       . cinacalcet (SENSIPAR) 30 MG tablet Take 30 mg by mouth daily with supper.       . diltiazem (CARDIZEM) 30 MG tablet Take 30 mg by mouth See admin instructions. Take 30mg  every 6 hours (at 6 AM, 12 PM, 6 PM, and 12 AM).  Hold 6 AM dose on  Monday, Wednesday, Friday.  Hold for SBP<120      . febuxostat (ULORIC) 40 MG tablet Take 40 mg by mouth daily.       Marland Kitchen guaifenesin (ROBITUSSIN) 100 MG/5ML syrup Take 200 mg by mouth every 4 (four) hours as needed for congestion.      . multivitamin (RENA-VIT) TABS tablet Take 1 tablet by mouth daily.      . pantoprazole (PROTONIX) 40 MG tablet Take 40 mg by mouth daily.      . sevelamer carbonate (RENVELA) 800 MG tablet Take 1,600 mg by mouth 3 (three) times daily with meals.      . tamsulosin (FLOMAX) 0.4 MG CAPS Take 0.4 mg by mouth daily after supper.       . traMADol (ULTRAM) 50 MG tablet Take 50 mg by mouth every 6 (six) hours as needed for pain.       Scheduled:  . anagrelide  0.5 mg Oral QODAY  . carvedilol  3.125 mg Oral BID WC  . cinacalcet  30 mg Oral Q supper  . ciprofloxacin  400 mg Intravenous Once  . [START ON 02/10/2013]  ciprofloxacin  400 mg Intravenous Q24H  . diltiazem  30 mg Oral See admin instructions  . febuxostat  40 mg Oral Daily  . loratadine  10 mg Oral Daily  . multivitamin  1 tablet Oral Daily  . multivitamin  1 tablet Oral QHS  . pantoprazole  40 mg Oral Daily  . sevelamer carbonate  1,600 mg Oral TID WC  . sodium chloride  3 mL Intravenous Q12H  . sodium chloride  3 mL Intravenous Q12H  . tamsulosin  0.4 mg Oral QPC supper  . vancomycin  1,000 mg Intravenous Once  . [START ON 02/10/2013] vancomycin  1,000 mg Intravenous Q M,W,F-HD    Assessment: 79yo male presents from SNF c/o fever and weakness after HD, concern for UTI/sepsis, to begin IV ABX.  Goal of Therapy:  Pre-HD vanc level 15-25  Plan:  Rec'd vancomycin 1250mg  IV in ED; will give additional vanc 1g to complete load then begin 1g IV after each HD as well as Cipro 400mg  IV Q24h and monitor CBC, Cx, levels prn.  Vernard Gambles, PharmD, BCPS  02/09/2013,3:47 AM

## 2013-02-09 NOTE — Progress Notes (Signed)
Transferred in from 2H-RM9 by bed awake and alert, with hard of hearing.

## 2013-02-10 LAB — BASIC METABOLIC PANEL
Creatinine, Ser: 4.08 mg/dL — ABNORMAL HIGH (ref 0.50–1.35)
GFR calc Af Amer: 14 mL/min — ABNORMAL LOW (ref >90)
GFR calc non Af Amer: 12 mL/min — ABNORMAL LOW (ref >90)
Glucose, Bld: 103 mg/dL — ABNORMAL HIGH (ref 70–99)
Potassium: 3.2 mEq/L — ABNORMAL LOW (ref 3.5–5.1)
Sodium: 129 mEq/L — ABNORMAL LOW (ref 135–145)

## 2013-02-10 LAB — GLUCOSE, CAPILLARY
Glucose-Capillary: 88 mg/dL (ref 70–99)
Glucose-Capillary: 105 mg/dL — ABNORMAL HIGH (ref 70–99)
Glucose-Capillary: 93 mg/dL (ref 70–99)

## 2013-02-10 LAB — URINE CULTURE

## 2013-02-10 MED ORDER — POTASSIUM CHLORIDE CRYS ER 20 MEQ PO TBCR
30.0000 meq | EXTENDED_RELEASE_TABLET | Freq: Two times a day (BID) | ORAL | Status: AC
  Administered 2013-02-10 (×2): 30 meq via ORAL
  Filled 2013-02-10 (×4): qty 1

## 2013-02-10 MED ORDER — POTASSIUM CHLORIDE CRYS ER 20 MEQ PO TBCR: 30.0000 meq | EXTENDED_RELEASE_TABLET | Freq: Two times a day (BID) | ORAL | Status: DC

## 2013-02-10 MED ORDER — VANCOMYCIN HCL IN DEXTROSE 1-5 GM/200ML-% IV SOLN
1000.0000 mg | Freq: Once | INTRAVENOUS | Status: AC
  Filled 2013-02-10: qty 200

## 2013-02-10 MED ORDER — SODIUM CHLORIDE 0.9 % IV SOLN: 62.5000 mg | INTRAVENOUS | Status: DC

## 2013-02-10 NOTE — ED Provider Notes (Signed)
History/physical exam/procedure(s) were performed by non-physician practitioner and as supervising physician I was immediately available for consultation/collaboration. I have reviewed all notes and am in agreement with care and plan.   Hilario Quarry, MD 02/10/13 [phone]

## 2013-02-10 NOTE — Consult Note (Signed)
[facility] Renal Consultation Note    Indication for Consultation:  Management of ESRD/hemodialysis; anemia, hypertension/volume and secondary hyperparathyroidism  HPI: Brent Mcneil is a 79 y.o. male ESRD patient (MWF HD) with past medical history significant for atrial fib, CVA, myeloproliferative disorder on anagrilide, and MI who presented to the ED with complaints of generalized weakness, worsening over the past few days, and an episode of nausea last evening. Upon admission, he was found to be in Afib/RVR, febrile and positive for UTI. Work-up thus far has included ST abdomen and pelvis which was negative for acute process. Blood and urine cultures are also negative. His atrial fib is now rate controlled, however SBPs are still somewhat soft. At the time of this encounter, he is sitting in bed, states that he feels better and hopes to go home tomorrow. His appetite is still poor as evidenced by less than 10% consumption of his lunch tray. He denies pain or further nausea. He is somewhat of a poor historian; much is history is taken from notes.  Past Medical History  Diagnosis Date  . Chronic kidney disease     HD- M-W-F  . End stage renal disease   . Uremic encephalopathy     Metabolic  . Anemia   . Gastrointestinal bleed   . Ulcer     Peptic and gastric   . Ulcerative esophagitis   . Myeloproliferative disorder   . Macular degeneration 1990s    bilateral  . History of thrombocytosis   . Myocardial infarction 1996  . Polio age 47  . Pneumonia   . Stroke     approx 6-7 years ago, affected speech for a short time  . Atrial flutter   . Atrial fibrillation 08/2012    treated with Carvedilol, Echo done 09/22/12   Past Surgical History  Procedure Laterality Date  . Colon resection  09-2006  . Tonsillectomy  1958  . Orthopedic surgeries  as a child    due to polio  . Cataract surgery Right 2004  . Colon surgery      with Colostomy  . Av fistula placement  Left 08/18/2012    Procedure: ARTERIOVENOUS (AV) FISTULA CREATION;  Surgeon: Nada Libman, MD;  Location: [facility] OR;  Service: Vascular;  Laterality: Left;  Marland Kitchen Eye surgery Right     cataract  . Cardiac catheterization  1996  . Av fistula placement Left 10/13/2012    Procedure: ARTERIOVENOUS (AV) FISTULA CREATION;  Surgeon: Nada Libman, MD;  Location: [facility] OR;  Service: Vascular;  Laterality: Left;   Family History  Problem Relation Age of Onset  . Cancer Father   . Lymphoma Brother   . Lung cancer Sister   . Emphysema Sister    Social History: Resides at [facility] in [place]  reports that he quit smoking about 56 years ago. He has quit using smokeless tobacco. His smokeless tobacco use included Chew. He reports that he does not drink alcohol or use illicit drugs.  No Known Allergies Prior to Admission medications   Medication Sig Start Date End Date Taking? Authorizing Provider  anagrelide (AGRYLIN) 0.5 MG capsule Take 0.5 mg by mouth every other day.   Yes Historical Provider, MD  aspirin 81 MG tablet Take 81 mg by mouth daily.   Yes Historical Provider, MD  beta carotene 16109 UNIT capsule Take 25,000 Units by mouth daily.   Yes Historical Provider, MD  carvedilol (COREG) 3.125 MG tablet Take 3.125 mg by  mouth 2 (two) times daily with a meal. Hold for SBP <120 Hold 8AM dose on Mondays Wednesdays and fridays. Last taken on 01-29-13   Yes Historical Provider, MD  cetirizine (ZYRTEC) 10 MG tablet Take 10 mg by mouth daily as needed for allergies.    Yes Historical Provider, MD  cinacalcet (SENSIPAR) 30 MG tablet Take 30 mg by mouth daily with supper.    Yes Historical Provider, MD  diltiazem (CARDIZEM) 30 MG tablet Take 30 mg by mouth See admin instructions. Take 30mg  every 6 hours (at 6 AM, 12 PM, 6 PM, and 12 AM).  Hold 6 AM dose on Monday, Wednesday, Friday.  Hold for SBP<120   Yes Historical Provider, MD  febuxostat (ULORIC) 40 MG tablet Take 40 mg by mouth  daily.    Yes Historical Provider, MD  guaifenesin (ROBITUSSIN) 100 MG/5ML syrup Take 200 mg by mouth every 4 (four) hours as needed for congestion.   Yes Historical Provider, MD  multivitamin (RENA-VIT) TABS tablet Take 1 tablet by mouth daily.   Yes Historical Provider, MD  pantoprazole (PROTONIX) 40 MG tablet Take 40 mg by mouth daily.   Yes Historical Provider, MD  sevelamer carbonate (RENVELA) 800 MG tablet Take 1,600 mg by mouth 3 (three) times daily with meals.   Yes Historical Provider, MD  tamsulosin (FLOMAX) 0.4 MG CAPS Take 0.4 mg by mouth daily after supper.    Yes Historical Provider, MD  traMADol (ULTRAM) 50 MG tablet Take 50 mg by mouth every 6 (six) hours as needed for pain.   Yes Historical Provider, MD   Current Facility-Administered Medications  Medication Dose Route Frequency Provider Last Rate Last Dose  . acetaminophen (TYLENOL) tablet 650 mg  650 mg Oral Q6H PRN Eduard Clos, MD       Or  . acetaminophen (TYLENOL) suppository 650 mg  650 mg Rectal Q6H PRN Eduard Clos, MD      . anagrelide (AGRYLIN) capsule 0.5 mg  0.5 mg Oral QODAY Eduard Clos, MD   0.5 mg at 02/09/13 0917  . carvedilol (COREG) tablet 3.125 mg  3.125 mg Oral BID WC Eduard Clos, MD   3.125 mg at 02/10/13 0759  . cinacalcet (SENSIPAR) tablet 30 mg  30 mg Oral Q supper Eduard Clos, MD   30 mg at 02/09/13 1647  . ciprofloxacin (CIPRO) IVPB 400 mg  400 mg Intravenous Once [employer], PA-C      . ciprofloxacin (CIPRO) IVPB 400 mg  400 mg Intravenous Q24H Calvert Cantor, MD   400 mg at 02/10/13 1108  . diltiazem (CARDIZEM CD) 24 hr capsule 180 mg  180 mg Oral Daily Calvert Cantor, MD   180 mg at 02/10/13 0939  . febuxostat (ULORIC) tablet 40 mg  40 mg Oral Daily Eduard Clos, MD   40 mg at 02/10/13 0940  . guaifenesin (ROBITUSSIN) 100 MG/5ML syrup 200 mg  200 mg Oral Q4H PRN Eduard Clos, MD      . loratadine (CLARITIN) tablet 10 mg  10 mg Oral Daily Eduard Clos, MD   10 mg at 02/10/13 0940  . multivitamin (PROSIGHT) tablet 1 tablet  1 tablet Oral Daily Eduard Clos, MD   1 tablet at 02/10/13 0940  . multivitamin (RENA-VIT) tablet 1 tablet  1 tablet Oral QHS Eduard Clos, MD   1 tablet at 02/09/13 2109  . ondansetron (ZOFRAN) tablet 4 mg  4 mg Oral Q6H PRN Meryle Ready  Toniann Fail, MD       Or  . ondansetron [facility]) injection 4 mg  4 mg Intravenous Q6H PRN Eduard Clos, MD      . pantoprazole (PROTONIX) EC tablet 40 mg  40 mg Oral Daily Eduard Clos, MD   40 mg at 02/10/13 0940  . sevelamer carbonate (RENVELA) tablet 1,600 mg  1,600 mg Oral TID WC Eduard Clos, MD   1,600 mg at 02/10/13 0759  . sodium chloride 0.9 % injection 3 mL  3 mL Intravenous Q12H Eduard Clos, MD      . sodium chloride 0.9 % injection 3 mL  3 mL Intravenous Q12H Eduard Clos, MD      . tamsulosin (FLOMAX) capsule 0.4 mg  0.4 mg Oral QPC supper Eduard Clos, MD   0.4 mg at 02/09/13 1647  . vancomycin (VANCOCIN) IVPB 1000 mg/200 mL premix  1,000 mg Intravenous Q M,W,F-HD Colleen Can, [facility]       Labs: Basic Metabolic Panel:  Recent Labs Lab 02/08/13 1939 02/09/13 0500 02/10/13 0430  NA 137 136 129*  K 3.0* 3.2* 3.2*  CL 96 96 91*  CO2 32 29 29  GLUCOSE 117* 124* 103*  BUN 17 22 35*  CREATININE 2.38* 2.78* 4.08*  CALCIUM 9.5 9.3 9.9   Liver Function Tests:  Recent Labs Lab 02/08/13 1939 02/09/13 0500  AST 51* 45*  ALT 30 25  ALKPHOS 164* 146*  BILITOT 3.7* 3.0*  PROT 6.6 6.0  ALBUMIN 2.7* 2.4*    Recent Labs Lab 02/09/13 0715  LIPASE 22   CBC:  Recent Labs Lab 02/08/13 1939 02/09/13 0500  WBC 11.5* 7.6  NEUTROABS 10.3* 6.5  HGB 11.3* 10.2*  HCT 34.5* 31.4*  MCV 101.8* 102.6*  PLT 323 285   CBG:  Recent Labs Lab 02/09/13 1211 02/09/13 1616 02/09/13 2106 02/10/13 0735 02/10/13 1158  GLUCAP 112* 88 102* 88 105*   Studies/Results: Dg Chest 2 View  02/08/2013    CLINICAL DATA:  Fever, weakness  EXAM: CHEST  2 VIEW  COMPARISON:  08/18/2012  FINDINGS: Cardiomegaly. Dual lumen right IJ catheter is unchanged in position. No acute infiltrate or pleural effusion. No pulmonary edema. Bony thorax is stable.  IMPRESSION: No active cardiopulmonary disease.   Electronically Signed   By: Natasha Mead M.D.   On: 02/08/2013 20:11   Ct Abdomen Pelvis W Contrast  02/09/2013   CLINICAL DATA:  Nausea and vomiting with elevated LFTs. On dialysis.  EXAM: CT ABDOMEN AND PELVIS WITH CONTRAST  TECHNIQUE: Multidetector CT imaging of the abdomen and pelvis was performed using the standard protocol following bolus administration of intravenous contrast.  CONTRAST:  OMNIPAQUE IOHEXOL 300 MG/ML  SOLN  COMPARISON:  None.  FINDINGS: Lung bases demonstrate a tiny amount of bilateral pleural fluid with associated atelectatic change. There is mild cardiomegaly. There is mild calcification along the right pericardium.  Abdominal images demonstrate splenomegaly as the spleen measures 18 cm in greatest diameter. There is possible mild gallbladder wall thickening. The liver, pancreas and adrenal glands are unremarkable. Kidneys are normal in size with several bilateral hypodensities likely hyperdense cysts as these have Hounsfield unit measurements greater than 10. The largest hyperdense mass is present over the mid pole of the left kidney measuring 2.5 cm with Hounsfield unit measurements of 38. There is no hydronephrosis or nephrolithiasis. Ureters are unremarkable. Appendix is normal. There is mild diverticulosis of the colon. There is a colostomy over the left  lower quadrant. There is a small bowel loop herniated to the colostomy defect. There is no evidence of bowel obstruction. There is moderate calcified atherosclerotic plaque over the abdominal aorta and iliac vessels.  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 Bladder is decompressed. There is mild prominence of the prostate  gland. There is moderate atrophy of the right gluteal muscles. There is moderate spondylosis throughout the spine with multilevel disc disease. Degenerative changes of the hips.  IMPRESSION: Possible mild gallbladder wall thickening. Right upper quadrant ultrasound may be helpful for further evaluation.  Moderate splenomegaly.  Several renal hypodensities likely slightly hyperdense cysts with the largest over the mid pole of the left kidney measuring 2.5 cm. Recommend further evaluation on elective basis with pre and post-contrast CT versus MRI for better evaluation.  Very small amount of bilateral pleural fluid with associated atelectasis. Mild cardiomegaly. Minimal calcification along the right pericardium.  Diverticulosis of the colon.  Left lower quadrant colostomy.   Electronically Signed   By: Elberta Fortis M.D.   On: 02/09/2013 13:55    ROS: 10 pt ROS asked and answered. Negative except as per HPI above.   Physical Exam: Filed Vitals:   02/10/13 0600 02/10/13 0759 02/10/13 0939 02/10/13 1000  BP: 108/43 114/45 121/56 99/35  Pulse: 90 91  69  Temp:  98.7 F (37.1 C)    TempSrc:  Oral    Resp: 20 25  23   Height:      Weight:      SpO2: 90% 98%  96%     General: Elderyl, chronically ill-appearing, slow mentation (unclear if this is his baseline)  in no acute distress. Head: Normocephalic, atraumatic, sclera non-icteric, mucus membranes are moist Neck: Supple. JVD not elevated. Lungs: Mostly clear. Poor effort. No appreciable wheezes, rales, or rhonchi. Breathing is unlabored on 2L 02 via [place] Heart: Irregular, rate controlled 70's on tele. with S1 S2. No murmurs, rubs, or gallops appreciated. Abdomen: Soft, non-tender, non-distended with normoactive bowel sounds. Colostomy to LLQ. No rebound/guarding. No obvious masses. M-S:  Strength and tone appear normal for age. Lower extremities: SCDs present. + pretibial edema. No ischemic changes, no open wounds  Neuro: Alert and oriented X 3.  Moves all extremities spontaneously. Psych:  Responds to questions appropriately with a normal affect. Dialysis Access: Rt TDC, exit site without drainage. New left AVF + bruit/ no evidence of infection  Dialysis Orders: Center: [facility]  on MWF. EDW 100 kg HD Bath 2K/2.25Ca Time 4:30 Heparin 6000 bolus/ 3000 mid.  Access Rt TDC/ New L AVF used x 4 (17g) BFR 250 DFR A1.5    Hectorol 0 mcg IV/HD Epogen 0 Units IV/HD  Venofer 50 mg weekly  Recent labs: hgb 10.9 Tsat 32%/Ferritin 493, P 4.7, PTH 403  Assessment/Plan: 1. SIRS - Unclear source per admit. Has TDC. CT abdomen/pelvis negative for acute process. BCs neg thus far. On empiric abx per pharmacy. 2. UTI - On cipro per pharmacy. Cx negative 3. HD access - Rt TDC, New left AVF used 4 times with 17g needles. No issues per pt. 4. ESRD -  MWF, K+ 3.2. Will replete po to avoid added Ca associated with 4K bath (see #6). HD first shift Sat. Follow labs 5. Hypertension/volume  - Soft SBPs on home Coreg 3.125 BID (hold for SBP < 120). Some pulm vasc congestion on CXR and LE edema. Under EDW here by almost 2kg. Will likely need decrease at d/c. Try for 1-2L as BP tolerates. Keep  SBP above 90. 6. Anemia  - Hgb 10.2, no op ESAs. Last Tsat 32%. Continue weekly IV Fe q Wed.  7. Metabolic bone disease -  Ca 9.9 (11.2 corrected). Phos controlled op on Renvela. Last PTH 403. No op Vit D due to hypercalcemia. Continue Sensipar. 8. Nutrition - Alb 2.4. Renal diet, multivitamin. Poor appetite 9. Atrial Fib - AFib/RVR upon admit. Likely due to #1. Treated with Cardizem. Not on anticoagulation due to AVMs. 10. Myeloproliferative disorder/thrombocytosis - on anagrelide  Claud Kelp, PA-C [facility] Pager [phone] 02/10/2013, 12:49 PM  02/10/13 Renal Attending Pt with reported Tmax recently of 99.9 degrees.  No obvious source and currently does not apppear septic.  We will support with hemodialysis.  Will verify AV functionality and try to get  tunneled catheter removed, Khalis Hittle C

## 2013-02-10 NOTE — Care Management Note (Signed)
    Page 1 of 1   02/10/2013     11:15:55 AM   CARE MANAGEMENT NOTE 02/10/2013  Patient:  Brent Mcneil, Brent Mcneil   Account Number:  [id number]  Date Initiated:  02/10/2013  Documentation initiated by:  Junius Creamer  Subjective/Objective Assessment:   adm w sepsis     Action/Plan:   [facility], pcp dr Donnel Saxon   Anticipated DC Date:     Anticipated DC Plan:    In-house referral  Clinical Social Worker      DC Planning Services  CM consult      Choice offered to / List presented to:             Status of service:   Medicare Important Message given?   (If response is "NO", the following Medicare IM given date fields will be blank) Date Medicare IM given:   Date Additional Medicare IM given:    Discharge Disposition:    Per UR Regulation:  Reviewed for med. necessity/level of care/duration of stay  If discussed at Long Length of Stay Meetings, dates discussed:    Comments:

## 2013-02-10 NOTE — Progress Notes (Addendum)
[facility] Progress Note [place] TEAM 1 - Stepdown/ICU TEAM   KALEV TEMME AVW:[id number] DOB: 08-06-1923 DOA: 02/08/2013 PCP: Galvin Proffer, MD  Brief narrative: This is a 79 year old male with past medical history of end-stage renal disease on hemodialysis, atrial fibrillation not on Coumadin due to AVMs, myeloproliferative disorder on an anagrelide who came to the ER because of a fever. She also complained of vomiting poor appetite and overall not feeling well for the past 2 days. UA revealed a urinary tract infection. She also had rapid ventricular response and was treated with Cardizem.  Assessment/Plan: Principal Problem:  Sepsis - fever/ hypotension, vomiting  -Likely due to UTI-CT of the abdomen reveals a mildly edematous gall bladder wall -patient eating without vomiting or abdominal pain. Exam is not consistent with cholecystitis. Urine culture is negative despite a strongly positive UA.  - stool in colostomy noted- no increased in output - no flu like symptoms  -WBC count improving and fevers resolved for now -blood cultures negative - at this point, would treat for UTI with 7 days of Cipro and Vanc  - recommend f/u with Urology to evaluate for BPH - Ct notes prominence of prostate- will be difficult to follow post void residuals as he is a dialysis pt and likely does not make much urine.   Active Problems:   Atrial fibrillation with RVR -Currently rate controlled with oral Diltiazem and Coreg -not a candidate for Coumadin do to AVM    ESRD (end stage renal disease) -Nephrology has been notified-    History of arteriovenous malformation (AVM)    MDS (myelodysplastic syndrome) -Continue anagrelide  Hypokalemia -Management per nephrology  Colostomy  Renal masses - likely cysts which are incindentally noted on CT - will need outpt MRI for f/u of this.     Code Status: Full code Family Communication: None Disposition Plan: Transfer to  telemetry/ OOB- PT eval  Consultants: Nepro  Procedures: None  Antibiotics: Vancomycin and ciprofloxacin 11/26  DVT prophylaxis: SCDs  HPI/Subjective: Patient alert. He is eating breakfast without complaints of abdominal pain or further vomiting. Has urinated this morning without any dysuria.    Objective: Blood pressure 114/45, pulse 91, temperature 98.7 F (37.1 C), temperature source Oral, resp. rate 25, height 5\' 11"  (1.803 m), weight 97.8 kg (215 lb 9.8 oz), SpO2 98.00%.  Intake/Output Summary (Last 24 hours) at 02/10/13 0853 Last data filed at 02/10/13 0600  Gross per 24 hour  Intake    710 ml  Output    100 ml  Net    610 ml     Exam: General: No acute respiratory distress Lungs: Clear to auscultation bilaterally without wheezes or crackles Cardiovascular: Irregular rate and rhythm without murmur gallop or rub normal S1 and S2 Abdomen: Nontender, nondistended, soft, bowel sounds positive, no rebound, no ascites, no appreciable mass-colostomy in LLQ with brown semi-formed stool  -  specifically no suprapubic tenderness Extremities: No significant cyanosis, clubbing, or edema bilateral lower extremities  Data Reviewed: Basic Metabolic Panel:  Recent Labs Lab 02/08/13 1939 02/09/13 0500 02/10/13 0430  NA 137 136 129*  K 3.0* 3.2* 3.2*  CL 96 96 91*  CO2 32 29 29  GLUCOSE 117* 124* 103*  BUN 17 22 35*  CREATININE 2.38* 2.78* 4.08*  CALCIUM 9.5 9.3 9.9   Liver Function Tests:  Recent Labs Lab 02/08/13 1939 02/09/13 0500  AST 51* 45*  ALT 30 25  ALKPHOS 164* 146*  BILITOT 3.7* 3.0*  PROT 6.6  6.0  ALBUMIN 2.7* 2.4*    Recent Labs Lab 02/09/13 0715  LIPASE 22   No results found for this basename: AMMONIA,  in the last 168 hours CBC:  Recent Labs Lab 02/08/13 1939 02/09/13 0500  WBC 11.5* 7.6  NEUTROABS 10.3* 6.5  HGB 11.3* 10.2*  HCT 34.5* 31.4*  MCV 101.8* 102.6*  PLT 323 285   Cardiac Enzymes: No results found for this  basename: CKTOTAL, CKMB, CKMBINDEX, TROPONINI,  in the last 168 hours BNP (last 3 results) No results found for this basename: PROBNP,  in the last 8760 hours CBG:  Recent Labs Lab 02/09/13 1211 02/09/13 1616 02/09/13 2106 02/10/13 0735  GLUCAP 112* 88 102* 88    Recent Results (from the past 240 hour(s))  CULTURE, BLOOD (ROUTINE X 2)     Status: None   Collection Time    02/09/13 12:52 AM      Result Value Range Status   Specimen Description BLOOD WRIST RIGHT   Final   Special Requests BOTTLES DRAWN AEROBIC ONLY 7.5CC   Final   Culture  Setup Time     Final   Value: 02/09/2013 11:23     Performed at [facility]   Culture     Final   Value:        BLOOD CULTURE RECEIVED NO GROWTH TO DATE CULTURE WILL BE HELD FOR 5 DAYS BEFORE ISSUING A FINAL NEGATIVE REPORT     Performed at [facility]   Report Status PENDING   Incomplete  CULTURE, BLOOD (ROUTINE X 2)     Status: None   Collection Time    02/09/13 12:55 AM      Result Value Range Status   Specimen Description BLOOD HAND RIGHT   Final   Special Requests BOTTLES DRAWN AEROBIC ONLY 2CC   Final   Culture  Setup Time     Final   Value: 02/09/2013 11:23     Performed at [facility]   Culture     Final   Value:        BLOOD CULTURE RECEIVED NO GROWTH TO DATE CULTURE WILL BE HELD FOR 5 DAYS BEFORE ISSUING A FINAL NEGATIVE REPORT     Performed at [facility]   Report Status PENDING   Incomplete  MRSA PCR SCREENING     Status: None   Collection Time    02/09/13  3:32 AM      Result Value Range Status   MRSA by PCR NEGATIVE  NEGATIVE Final   Comment:            The GeneXpert MRSA Assay (FDA     approved for NASAL specimens     only), is one component of a     comprehensive MRSA colonization     surveillance program. It is not     intended to diagnose MRSA     infection nor to guide or     monitor treatment for     MRSA infections.     Studies:  Recent x-ray studies have been  reviewed in detail by the Attending Physician  Scheduled Meds:  Scheduled Meds: . anagrelide  0.5 mg Oral QODAY  . carvedilol  3.125 mg Oral BID WC  . cinacalcet  30 mg Oral Q supper  . ciprofloxacin  400 mg Intravenous Once  . ciprofloxacin  400 mg Intravenous Q24H  . diltiazem  180 mg Oral Daily  . febuxostat  40 mg Oral  Daily  . loratadine  10 mg Oral Daily  . multivitamin  1 tablet Oral Daily  . multivitamin  1 tablet Oral QHS  . pantoprazole  40 mg Oral Daily  . sevelamer carbonate  1,600 mg Oral TID WC  . sodium chloride  3 mL Intravenous Q12H  . sodium chloride  3 mL Intravenous Q12H  . tamsulosin  0.4 mg Oral QPC supper  . vancomycin  1,000 mg Intravenous Q M,W,F-HD   Continuous Infusions:   Time spent on care of this patient: 35 minute   Calvert Cantor, MD  [facility] Office  [phone] Pager - Text Page per Loretha Stapler as per below:  On-Call/Text Page:      [email or web address]      password [email or web address]  If 7PM-7AM, please contact night-coverage [email or web address] Password [facility] 02/10/2013, 8:53 AM   LOS: 2 days

## 2013-02-11 LAB — CBC
HCT: 32.4 % — ABNORMAL LOW (ref 39.0–52.0)
Hemoglobin: 10.6 g/dL — ABNORMAL LOW (ref 13.0–17.0)
MCH: 32.7 pg (ref 26.0–34.0)
MCHC: 32.7 g/dL (ref 30.0–36.0)
MCV: 100 fL (ref 78.0–100.0)
RBC: 3.24 MIL/uL — ABNORMAL LOW (ref 4.22–5.81)

## 2013-02-11 LAB — RENAL FUNCTION PANEL
Albumin: 2.4 g/dL — ABNORMAL LOW (ref 3.5–5.2)
CO2: 28 mEq/L (ref 19–32)
Calcium: 9.6 mg/dL (ref 8.4–10.5)
GFR calc Af Amer: 11 mL/min — ABNORMAL LOW (ref >90)
Glucose, Bld: 99 mg/dL (ref 70–99)
Potassium: 4.3 mEq/L (ref 3.5–5.1)
Sodium: 130 mEq/L — ABNORMAL LOW (ref 135–145)

## 2013-02-11 MED ORDER — HEPARIN SODIUM (PORCINE) 1000 UNIT/ML IJ SOLN
6000.0000 [IU] | Freq: Once | INTRAMUSCULAR | Status: AC
  Administered 2013-02-11: 6000 [IU] via INTRAVENOUS

## 2013-02-11 MED ORDER — CIPROFLOXACIN HCL 500 MG PO TABS: 500.0000 mg | ORAL_TABLET | Freq: Every day | ORAL | Status: DC

## 2013-02-11 MED ORDER — HEPARIN SODIUM (PORCINE) 1000 UNIT/ML IJ SOLN
3000.0000 [IU] | Freq: Once | INTRAMUSCULAR | Status: AC
  Administered 2013-02-11: 3000 [IU] via INTRAVENOUS

## 2013-02-11 MED ORDER — VANCOMYCIN HCL IN DEXTROSE 1-5 GM/200ML-% IV SOLN: 1000.0000 mg | INTRAVENOUS | Status: DC

## 2013-02-11 NOTE — Procedures (Signed)
Stable on dialysis. Hemodynamics are stable. Able to cannulate arm without difficulty and BFR of 300cc/min. He dialyzes in [place].  I have spoken with staff there and they have had no issues with access therefore we will request catheter removal with question of infection. Jhalil Silvera C

## 2013-02-11 NOTE — Discharge Summary (Signed)
Physician Discharge Summary  Brent Mcneil WJX:[id number] DOB: May 24, 1923 DOA: 02/08/2013  PCP: Galvin Proffer, MD  Admit date: 02/08/2013 Discharge date: 02/11/2013  Time spent: >45 minutes  Recommendations for Outpatient Follow-up:  1. outpt f/u with Urology 2. Repeat UA in 3 days  Discharge Diagnoses:  Principal Problem:   SIRS (systemic inflammatory response syndrome) Active Problems:   Atrial fibrillation with RVR   ESRD (end stage renal disease)   History of arteriovenous malformation (AVM)   MDS (myelodysplastic syndrome)   Discharge Condition: stable  Diet recommendation: heart healthy and renal diet  Filed Weights   02/11/13 0400 02/11/13 0820 02/11/13 1257  Weight: 103 kg (227 lb 1.2 oz) 102.3 kg (225 lb 8.5 oz) 100.3 kg (221 lb 1.9 oz)    History of present illness:  This is a 79 year old male with past medical history of end-stage renal disease on hemodialysis, atrial fibrillation not on Coumadin due to AVMs, myeloproliferative disorder on an anagrelide who came to the ER because of a fever. She also complained of vomiting poor appetite and overall not feeling well for the past 2 days. UA revealed a urinary tract infection. She also had rapid ventricular response and was treated with Cardizem.   Hospital Course:   Sepsis - fever/ hypotension, vomiting  -Likely due to UTI-CT of the abdomen reveals a mildly edematous gall bladder wall -patient eating without vomiting or abdominal pain. Exam is not consistent with cholecystitis. - Urine culture is negative despite a strongly positive UA.  - stool in colostomy noted- no increased in output - no flu like symptoms  -WBC count improving and fevers resolved for now -blood cultures negative - renal recommends removing dialysis cath- pt dialyzing via fistula   UTI- possible BPH - despite negative urine culture, UA was grossly positive - at this point, would treat current sepsis by treating for UTI with 7 days  of Cipro and Vanc- repeat UA prior to stopping antibiotics  - recommend f/u with Urology to evaluate for BPH - Ct notes prominence of prostate- will be difficult to follow post void residuals as he is a dialysis pt and likely does not make much urine.   Atrial fibrillation with RVR  -Currently rate controlled with oral Diltiazem and Coreg  -not a candidate for Coumadin do to AVM    ESRD (end stage renal disease)  -Nephrology has been notified-    History of arteriovenous malformation (AVM)  MDS (myelodysplastic syndrome)  -Continue anagrelide    Hypokalemia  -Management per nephrology    Colostomy   Renal masses  - likely cysts which are incindentally noted on CT  - will need outpt MRI for f/u of this.    Consultations:  nephro  Discharge Exam: Filed Vitals:   02/11/13 1257  BP: 124/62  Pulse: 78  Temp: 98 F (36.7 C)  Resp: 30    General: No acute respiratory distress  Lungs: Clear to auscultation bilaterally without wheezes or crackles  Cardiovascular: Irregular rate and rhythm without murmur gallop or rub normal S1 and S2  Abdomen: Nontender, nondistended, soft, bowel sounds positive, no rebound, no ascites, no appreciable mass-colostomy in LLQ with brown semi-formed stool  - specifically no suprapubic tenderness  Extremities: No significant cyanosis, clubbing, or edema bilateral lower extremities   Discharge Instructions  Discharge Orders   Future Orders Complete By Expires   Diet - low sodium heart healthy  As directed    Scheduling Instructions:     Renal diet  Discharge instructions  As directed    Comments:     UA will need to be performed and ensure it is without infection before stopping Vancomycin and Ciprofloxacin.   Increase activity slowly  As directed        Medication List         anagrelide 0.5 MG capsule  Commonly known as:  AGRYLIN  Take 0.5 mg by mouth every other day.     aspirin 81 MG tablet  Take 81 mg by mouth daily.      beta carotene 16109 UNIT capsule  Take 25,000 Units by mouth daily.     carvedilol 3.125 MG tablet  Commonly known as:  COREG  Take 3.125 mg by mouth 2 (two) times daily with a meal. Hold for SBP <120 Hold 8AM dose on Mondays Wednesdays and fridays. Last taken on 01-29-13     cetirizine 10 MG tablet  Commonly known as:  ZYRTEC  Take 10 mg by mouth daily as needed for allergies.     cinacalcet 30 MG tablet  Commonly known as:  SENSIPAR  Take 30 mg by mouth daily with supper.     ciprofloxacin 500 MG tablet  Commonly known as:  CIPRO  Take 1 tablet (500 mg total) by mouth daily with breakfast.     diltiazem 30 MG tablet  Commonly known as:  CARDIZEM  Take 30 mg by mouth See admin instructions. Take 30mg  every 6 hours (at 6 AM, 12 PM, 6 PM, and 12 AM).  Hold 6 AM dose on Monday, Wednesday, Friday.  Hold for SBP<120     febuxostat 40 MG tablet  Commonly known as:  ULORIC  Take 40 mg by mouth daily.     guaifenesin 100 MG/5ML syrup  Commonly known as:  ROBITUSSIN  Take 200 mg by mouth every 4 (four) hours as needed for congestion.     multivitamin Tabs tablet  Take 1 tablet by mouth daily.     pantoprazole 40 MG tablet  Commonly known as:  PROTONIX  Take 40 mg by mouth daily.     sevelamer carbonate 800 MG tablet  Commonly known as:  RENVELA  Take 1,600 mg by mouth 3 (three) times daily with meals.     tamsulosin 0.4 MG Caps capsule  Commonly known as:  FLOMAX  Take 0.4 mg by mouth daily after supper.     traMADol 50 MG tablet  Commonly known as:  ULTRAM  Take 50 mg by mouth every 6 (six) hours as needed for pain.     vancomycin 1 GM/200ML Soln  Commonly known as:  VANCOCIN  Inject 200 mLs (1,000 mg total) into the vein every Monday, Wednesday, and Friday with hemodialysis.       No Known Allergies     Follow-up Information   Follow up with [facility] In 1 week.   Contact information:   [address] [place] [address] [place]  [address] [phone]       The results of significant diagnostics from this hospitalization (including imaging, microbiology, ancillary and laboratory) are listed below for reference.    Significant Diagnostic Studies: Dg Chest 2 View  02/08/2013   CLINICAL DATA:  Fever, weakness  EXAM: CHEST  2 VIEW  COMPARISON:  08/18/2012  FINDINGS: Cardiomegaly. Dual lumen right IJ catheter is unchanged in position. No acute infiltrate or pleural effusion. No pulmonary edema. Bony thorax is stable.  IMPRESSION: No active cardiopulmonary disease.   Electronically Signed  By: Natasha Mead M.D.   On: 02/08/2013 20:11   Ct Abdomen Pelvis W Contrast  02/09/2013   CLINICAL DATA:  Nausea and vomiting with elevated LFTs. On dialysis.  EXAM: CT ABDOMEN AND PELVIS WITH CONTRAST  TECHNIQUE: Multidetector CT imaging of the abdomen and pelvis was performed using the standard protocol following bolus administration of intravenous contrast.  CONTRAST:  OMNIPAQUE IOHEXOL 300 MG/ML  SOLN  COMPARISON:  None.  FINDINGS: Lung bases demonstrate a tiny amount of bilateral pleural fluid with associated atelectatic change. There is mild cardiomegaly. There is mild calcification along the right pericardium.  Abdominal images demonstrate splenomegaly as the spleen measures 18 cm in greatest diameter. There is possible mild gallbladder wall thickening. The liver, pancreas and adrenal glands are unremarkable. Kidneys are normal in size with several bilateral hypodensities likely hyperdense cysts as these have Hounsfield unit measurements greater than 10. The largest hyperdense mass is present over the mid pole of the left kidney measuring 2.5 cm with Hounsfield unit measurements of 38. There is no hydronephrosis or nephrolithiasis. Ureters are unremarkable. Appendix is normal. There is mild diverticulosis of the colon. There is a colostomy over the left lower quadrant. There is a small bowel loop herniated to the colostomy defect. There  is no evidence of bowel obstruction. There is moderate calcified atherosclerotic plaque over the abdominal aorta and iliac vessels.  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 Bladder is decompressed. There is mild prominence of the prostate gland. There is moderate atrophy of the right gluteal muscles. There is moderate spondylosis throughout the spine with multilevel disc disease. Degenerative changes of the hips.  IMPRESSION: Possible mild gallbladder wall thickening. Right upper quadrant ultrasound may be helpful for further evaluation.  Moderate splenomegaly.  Several renal hypodensities likely slightly hyperdense cysts with the largest over the mid pole of the left kidney measuring 2.5 cm. Recommend further evaluation on elective basis with pre and post-contrast CT versus MRI for better evaluation.  Very small amount of bilateral pleural fluid with associated atelectasis. Mild cardiomegaly. Minimal calcification along the right pericardium.  Diverticulosis of the colon.  Left lower quadrant colostomy.   Electronically Signed   By: Elberta Fortis M.D.   On: 02/09/2013 13:55    Microbiology: Recent Results (from the past 240 hour(s))  URINE CULTURE     Status: None   Collection Time    02/08/13 10:20 PM      Result Value Range Status   Specimen Description URINE, CLEAN CATCH   Final   Special Requests Normal   Final   Culture  Setup Time     Final   Value: 02/09/2013 11:22     Performed at [facility] Count     Final   Value: 5,000 COLONIES/ML     Performed at [facility]   Culture     Final   Value: INSIGNIFICANT GROWTH     Performed at [facility]   Report Status 02/10/2013 FINAL   Final  CULTURE, BLOOD (ROUTINE X 2)     Status: None   Collection Time    02/09/13 12:52 AM      Result Value Range Status   Specimen Description BLOOD WRIST RIGHT   Final   Special Requests BOTTLES DRAWN AEROBIC ONLY 7.5CC   Final   Culture  Setup Time      Final   Value: 02/09/2013 11:23     Performed at [facility]  Culture     Final   Value:        BLOOD CULTURE RECEIVED NO GROWTH TO DATE CULTURE WILL BE HELD FOR 5 DAYS BEFORE ISSUING A FINAL NEGATIVE REPORT     Performed at [facility]   Report Status PENDING   Incomplete  CULTURE, BLOOD (ROUTINE X 2)     Status: None   Collection Time    02/09/13 12:55 AM      Result Value Range Status   Specimen Description BLOOD HAND RIGHT   Final   Special Requests BOTTLES DRAWN AEROBIC ONLY 2CC   Final   Culture  Setup Time     Final   Value: 02/09/2013 11:23     Performed at [facility]   Culture     Final   Value:        BLOOD CULTURE RECEIVED NO GROWTH TO DATE CULTURE WILL BE HELD FOR 5 DAYS BEFORE ISSUING A FINAL NEGATIVE REPORT     Performed at [facility]   Report Status PENDING   Incomplete  MRSA PCR SCREENING     Status: None   Collection Time    02/09/13  3:32 AM      Result Value Range Status   MRSA by PCR NEGATIVE  NEGATIVE Final   Comment:            The GeneXpert MRSA Assay (FDA     approved for NASAL specimens     only), is one component of a     comprehensive MRSA colonization     surveillance program. It is not     intended to diagnose MRSA     infection nor to guide or     monitor treatment for     MRSA infections.     Labs: Basic Metabolic Panel:  Recent Labs Lab 02/08/13 1939 02/09/13 0500 02/10/13 0430 02/11/13 0414  NA 137 136 129* 130*  K 3.0* 3.2* 3.2* 4.3  CL 96 96 91* 88*  CO2 32 29 29 28   GLUCOSE 117* 124* 103* 99  BUN 17 22 35* 50*  CREATININE 2.38* 2.78* 4.08* 5.13*  CALCIUM 9.5 9.3 9.9 9.6  PHOS  --   --   --  5.1*   Liver Function Tests:  Recent Labs Lab 02/08/13 1939 02/09/13 0500 02/11/13 0414  AST 51* 45*  --   ALT 30 25  --   ALKPHOS 164* 146*  --   BILITOT 3.7* 3.0*  --   PROT 6.6 6.0  --   ALBUMIN 2.7* 2.4* 2.4*    Recent Labs Lab 02/09/13 0715  LIPASE 22   No results found for  this basename: AMMONIA,  in the last 168 hours CBC:  Recent Labs Lab 02/08/13 1939 02/09/13 0500 02/11/13 0415  WBC 11.5* 7.6 6.4  NEUTROABS 10.3* 6.5  --   HGB 11.3* 10.2* 10.6*  HCT 34.5* 31.4* 32.4*  MCV 101.8* 102.6* 100.0  PLT 323 285 301   Cardiac Enzymes: No results found for this basename: CKTOTAL, CKMB, CKMBINDEX, TROPONINI,  in the last 168 hours BNP: BNP (last 3 results) No results found for this basename: PROBNP,  in the last 8760 hours CBG:  Recent Labs Lab 02/10/13 0735 02/10/13 1158 02/10/13 1617 02/10/13 2127 02/11/13 0757  GLUCAP 88 105* 95 93 98       Signed:  Persais Ethridge  [facility] 02/11/2013, 2:09 PM

## 2013-02-11 NOTE — Progress Notes (Signed)
CSW received call from RN, Daniel Nones that patient is ready to discharge back to their ALF, [facility], where patient's son is Catering manager. FL2 completed & signed by Dr. Butler Denmark. Discharge packet given to RN, Portia. Family to transport when ready.   Unice Bailey, LCSW Clinical Social Worker cell #: [phone] (weekend coverage)

## 2013-02-15 LAB — CULTURE, BLOOD (ROUTINE X 2)
Culture: NO GROWTH
Culture: NO GROWTH

## 2013-10-15 ENCOUNTER — Emergency Department (HOSPITAL_COMMUNITY): Payer: Medicare Other

## 2013-10-15 ENCOUNTER — Encounter (HOSPITAL_COMMUNITY): Payer: Self-pay | Admitting: Emergency Medicine

## 2013-10-15 ENCOUNTER — Emergency Department (HOSPITAL_COMMUNITY)
Admission: EM | Admit: 2013-10-15 | Discharge: 2013-10-15 | Disposition: A | Discharge status: Home / Self Care | Payer: Medicare Other | Attending: Emergency Medicine | Admitting: Emergency Medicine

## 2013-10-15 DIAGNOSIS — Z8669 Personal history of other diseases of the nervous system and sense organs: Secondary | ICD-10-CM | POA: Diagnosis not present

## 2013-10-15 DIAGNOSIS — K5732 Diverticulitis of large intestine without perforation or abscess without bleeding: Secondary | ICD-10-CM | POA: Diagnosis not present

## 2013-10-15 DIAGNOSIS — R52 Pain, unspecified: Secondary | ICD-10-CM | POA: Insufficient documentation

## 2013-10-15 DIAGNOSIS — R5383 Other fatigue: Secondary | ICD-10-CM

## 2013-10-15 DIAGNOSIS — Z992 Dependence on renal dialysis: Secondary | ICD-10-CM | POA: Insufficient documentation

## 2013-10-15 DIAGNOSIS — Z862 Personal history of diseases of the blood and blood-forming organs and certain disorders involving the immune mechanism: Secondary | ICD-10-CM | POA: Insufficient documentation

## 2013-10-15 DIAGNOSIS — IMO0002 Reserved for concepts with insufficient information to code with codable children: Secondary | ICD-10-CM | POA: Insufficient documentation

## 2013-10-15 DIAGNOSIS — I252 Old myocardial infarction: Secondary | ICD-10-CM | POA: Diagnosis not present

## 2013-10-15 DIAGNOSIS — M545 Low back pain, unspecified: Secondary | ICD-10-CM | POA: Diagnosis not present

## 2013-10-15 DIAGNOSIS — K5792 Diverticulitis of intestine, part unspecified, without perforation or abscess without bleeding: Secondary | ICD-10-CM

## 2013-10-15 DIAGNOSIS — Z9889 Other specified postprocedural states: Secondary | ICD-10-CM | POA: Insufficient documentation

## 2013-10-15 DIAGNOSIS — Z8619 Personal history of other infectious and parasitic diseases: Secondary | ICD-10-CM | POA: Diagnosis not present

## 2013-10-15 DIAGNOSIS — Z8673 Personal history of transient ischemic attack (TIA), and cerebral infarction without residual deficits: Secondary | ICD-10-CM | POA: Diagnosis not present

## 2013-10-15 DIAGNOSIS — I4891 Unspecified atrial fibrillation: Secondary | ICD-10-CM | POA: Insufficient documentation

## 2013-10-15 DIAGNOSIS — Z7982 Long term (current) use of aspirin: Secondary | ICD-10-CM | POA: Diagnosis not present

## 2013-10-15 DIAGNOSIS — Z8701 Personal history of pneumonia (recurrent): Secondary | ICD-10-CM | POA: Diagnosis not present

## 2013-10-15 DIAGNOSIS — N186 End stage renal disease: Secondary | ICD-10-CM | POA: Insufficient documentation

## 2013-10-15 DIAGNOSIS — Z872 Personal history of diseases of the skin and subcutaneous tissue: Secondary | ICD-10-CM | POA: Diagnosis not present

## 2013-10-15 DIAGNOSIS — R5381 Other malaise: Secondary | ICD-10-CM | POA: Insufficient documentation

## 2013-10-15 DIAGNOSIS — Z87891 Personal history of nicotine dependence: Secondary | ICD-10-CM | POA: Diagnosis not present

## 2013-10-15 DIAGNOSIS — M7989 Other specified soft tissue disorders: Secondary | ICD-10-CM

## 2013-10-15 DIAGNOSIS — Z7902 Long term (current) use of antithrombotics/antiplatelets: Secondary | ICD-10-CM | POA: Insufficient documentation

## 2013-10-15 LAB — CBC WITH DIFFERENTIAL/PLATELET
BASOS PCT: 0 % (ref 0–1)
Basophils Absolute: 0 10*3/uL (ref 0.0–0.1)
EOS PCT: 1 % (ref 0–5)
Eosinophils Absolute: 0.1 10*3/uL (ref 0.0–0.7)
HEMATOCRIT: 35 % — AB (ref 39.0–52.0)
Hemoglobin: 11.4 g/dL — ABNORMAL LOW (ref 13.0–17.0)
Lymphocytes Relative: 7 % — ABNORMAL LOW (ref 12–46)
Lymphs Abs: 1 10*3/uL (ref 0.7–4.0)
MCH: 32.8 pg (ref 26.0–34.0)
MCHC: 32.6 g/dL (ref 30.0–36.0)
MCV: 100.6 fL — ABNORMAL HIGH (ref 78.0–100.0)
MONO ABS: 1.3 10*3/uL — AB (ref 0.1–1.0)
Monocytes Relative: 10 % (ref 3–12)
Neutro Abs: 11.3 10*3/uL — ABNORMAL HIGH (ref 1.7–7.7)
Neutrophils Relative %: 82 % — ABNORMAL HIGH (ref 43–77)
Platelets: 422 10*3/uL — ABNORMAL HIGH (ref 150–400)
RBC: 3.48 MIL/uL — ABNORMAL LOW (ref 4.22–5.81)
RDW: 13.6 % (ref 11.5–15.5)
WBC: 13.7 10*3/uL — ABNORMAL HIGH (ref 4.0–10.5)

## 2013-10-15 LAB — COMPREHENSIVE METABOLIC PANEL
ALBUMIN: 3.3 g/dL — AB (ref 3.5–5.2)
ALT: 11 U/L (ref 0–53)
ANION GAP: 15 (ref 5–15)
AST: 21 U/L (ref 0–37)
Alkaline Phosphatase: 160 U/L — ABNORMAL HIGH (ref 39–117)
BILIRUBIN TOTAL: 0.6 mg/dL (ref 0.3–1.2)
BUN: 37 mg/dL — AB (ref 6–23)
CALCIUM: 10.7 mg/dL — AB (ref 8.4–10.5)
CHLORIDE: 89 meq/L — AB (ref 96–112)
CO2: 26 mEq/L (ref 19–32)
CREATININE: 3.39 mg/dL — AB (ref 0.50–1.35)
GFR calc Af Amer: 17 mL/min — ABNORMAL LOW (ref >90)
GFR calc non Af Amer: 15 mL/min — ABNORMAL LOW (ref >90)
Glucose, Bld: 105 mg/dL — ABNORMAL HIGH (ref 70–99)
Potassium: 4.5 mEq/L (ref 3.7–5.3)
Sodium: 130 mEq/L — ABNORMAL LOW (ref 137–147)
Total Protein: 7.8 g/dL (ref 6.0–8.3)

## 2013-10-15 MED ORDER — METRONIDAZOLE IN NACL 5-0.79 MG/ML-% IV SOLN: 500.0000 mg | Freq: Once | INTRAVENOUS | Status: DC

## 2013-10-15 MED ORDER — CIPROFLOXACIN IN D5W 400 MG/200ML IV SOLN
400.0000 mg | Freq: Once | INTRAVENOUS | Status: DC
Start: 2013-10-15 — End: 2013-10-15

## 2013-10-15 MED ORDER — OXYCODONE-ACETAMINOPHEN 5-325 MG PO TABS
1.0000 | ORAL_TABLET | Freq: Once | ORAL | Status: AC
  Administered 2013-10-15: 1 via ORAL
  Filled 2013-10-15: qty 1

## 2013-10-15 MED ORDER — CIPROFLOXACIN HCL 500 MG PO TABS
500.0000 mg | ORAL_TABLET | Freq: Once | ORAL | Status: AC
  Administered 2013-10-15: 500 mg via ORAL
  Filled 2013-10-15: qty 1

## 2013-10-15 MED ORDER — CIPROFLOXACIN HCL 500 MG PO TABS: 500.0000 mg | ORAL_TABLET | Freq: Every day | ORAL | Status: DC

## 2013-10-15 MED ORDER — OXYCODONE-ACETAMINOPHEN 5-325 MG PO TABS: 1.0000 | ORAL_TABLET | ORAL | Status: DC | PRN

## 2013-10-15 MED ORDER — METRONIDAZOLE 500 MG PO TABS
500.0000 mg | ORAL_TABLET | Freq: Once | ORAL | Status: AC
  Administered 2013-10-15: 500 mg via ORAL
  Filled 2013-10-15: qty 1

## 2013-10-15 MED ORDER — ACETAMINOPHEN 325 MG PO TABS: 325.0000 mg | ORAL_TABLET | Freq: Four times a day (QID) | ORAL | Status: AC | PRN

## 2013-10-15 MED ORDER — METRONIDAZOLE 500 MG PO TABS: 500.0000 mg | ORAL_TABLET | Freq: Two times a day (BID) | ORAL | Status: DC

## 2013-10-15 NOTE — ED Notes (Signed)
Pt c/o diagnosed with a compression fracture couple weeks ago. Pt has tried oxycodone without relief. Last dose was 6 hours PTA.

## 2013-10-15 NOTE — ED Provider Notes (Addendum)
CSN: [id number]     Arrival date & time 10/15/13  1447 History   First MD Initiated Contact with Patient 10/15/13 1735     Chief Complaint  Patient presents with  . Back Pain     (Consider location/radiation/quality/duration/timing/severity/associated sxs/prior Treatment) HPI Comments: Patient presents with 2 week history of low back pain. He was diagnosed with a compression fracture by his PCP 2 weeks ago. Is taking oxycodone without relief. His last dose today was greater than 6 hours ago. He is wheelchair-bound at baseline secondary to lower extremity weakness from polio. There is no new weakness, numbness or tingling. He is a dialysis patient does not make much urine. He urinates about once a day and has not had any incontinence. He has a colostomy bag in place with no change in the output. Denies any upper extremity weakness, numbness or tingling. No headache, dizziness, chest pain or shortness of breath. His daughter became concerned today when the pain seem worse than it has been over the past several days.  The history is provided by the patient.    Past Medical History  Diagnosis Date  . Chronic kidney disease     HD- M-W-F  . End stage renal disease   . Uremic encephalopathy     Metabolic  . Anemia   . Gastrointestinal bleed   . Ulcer     Peptic and gastric   . Ulcerative esophagitis   . Myeloproliferative disorder   . Macular degeneration 1990s    bilateral  . History of thrombocytosis   . Myocardial infarction 1996  . Polio age 69  . Pneumonia   . Stroke     approx 6-7 years ago, affected speech for a short time  . Atrial flutter   . Atrial fibrillation 08/2012    treated with Carvedilol, Echo done 09/22/12   Past Surgical History  Procedure Laterality Date  . Colon resection  09-2006  . Tonsillectomy  1958  . Orthopedic surgeries  as a child    due to polio  . Cataract surgery Right 2004  . Colon surgery      with Colostomy  . Av fistula placement Left  08/18/2012    Procedure: ARTERIOVENOUS (AV) FISTULA CREATION;  Surgeon: Serafina Mitchell, MD;  Location: [facility];  Service: Vascular;  Laterality: Left;  Marland Kitchen Eye surgery Right     cataract  . Cardiac catheterization  1996  . Av fistula placement Left 10/13/2012    Procedure: ARTERIOVENOUS (AV) FISTULA CREATION;  Surgeon: Serafina Mitchell, MD;  Location: [facility];  Service: Vascular;  Laterality: Left;   Family History  Problem Relation Age of Onset  . Cancer Father   . Lymphoma Brother   . Lung cancer Sister   . Emphysema Sister    History  Substance Use Topics  . Smoking status: Former Smoker    Quit date: 09/28/1956  . Smokeless tobacco: Former Systems developer    Types: Chew  . Alcohol Use: No    Review of Systems  Constitutional: Positive for fatigue. Negative for fever, activity change and appetite change.  HENT: Negative for congestion and rhinorrhea.   Respiratory: Negative for cough, chest tightness and shortness of breath.   Cardiovascular: Negative for chest pain.  Gastrointestinal: Negative for nausea, vomiting and abdominal pain.  Genitourinary: Negative for dysuria.  Musculoskeletal: Positive for back pain and gait problem.  Skin: Negative for wound.  Neurological: Positive for weakness. Negative for dizziness and headaches.  Allergies  Review of patient's allergies indicates no known allergies.  Home Medications   Prior to Admission medications   Medication Sig Start Date End Date Taking? Authorizing Provider  anagrelide (AGRYLIN) 0.5 MG capsule Take 0.5 mg by mouth every other day.   Yes Historical Provider, MD  aspirin 81 MG tablet Take 81 mg by mouth every other day.    Yes Historical Provider, MD  beta carotene 25000 UNIT capsule Take 25,000 Units by mouth every morning.    Yes Historical Provider, MD  bisacodyl (DULCOLAX) 5 MG EC tablet Take 5 mg by mouth 2 (two) times daily as needed for moderate constipation.   Yes Historical Provider, MD  calcium acetate (PHOSLO)  667 MG capsule Take 667-1,334 mg by mouth 3 (three) times daily with meals. Take 1,334mg  (two capsules) by mouth at breakfast, and take 667mg  (one capsule) with lunch and supper.   Yes Historical Provider, MD  carvedilol (COREG) 3.125 MG tablet Take 3.125 mg by mouth 2 (two) times daily with a meal. Hold for SBP <110 Hold 8AM dose on Mondays Wednesdays and Fridays.   Yes Historical Provider, MD  cinacalcet (SENSIPAR) 30 MG tablet Take 30 mg by mouth daily with supper.    Yes Historical Provider, MD  finasteride (PROSCAR) 5 MG tablet Take 5 mg by mouth every morning.   Yes Historical Provider, MD  guaiFENesin-codeine (ROBITUSSIN AC) 100-10 MG/5ML syrup Take 5 mLs by mouth every 6 (six) hours as needed for cough.   Yes Historical Provider, MD  multivitamin (RENA-VIT) TABS tablet Take 1 tablet by mouth every morning.    Yes Historical Provider, MD  ondansetron (ZOFRAN) 4 MG tablet Take 4 mg by mouth every 4 (four) hours as needed for vomiting.   Yes Historical Provider, MD  oxyCODONE (OXY IR/ROXICODONE) 5 MG immediate release tablet Take 5 mg by mouth every 6 (six) hours as needed for severe pain.   Yes Historical Provider, MD  pantoprazole (PROTONIX) 40 MG tablet Take 40 mg by mouth every morning.    Yes Historical Provider, MD  traMADol (ULTRAM) 50 MG tablet Take 50 mg by mouth every 6 (six) hours as needed for pain.   Yes Historical Provider, MD  triamcinolone cream (KENALOG) 0.1 % Apply 1 application topically as needed.   Yes Historical Provider, MD  acetaminophen (TYLENOL) 325 MG tablet Take 1 tablet (325 mg total) by mouth every 6 (six) hours as needed for moderate pain (take with oxycodone until percocet arrives.  Do not take with percocet). 10/15/13   [name], MD  ciprofloxacin (CIPRO) 500 MG tablet Take 1 tablet (500 mg total) by mouth daily. 10/15/13   [name], MD  metroNIDAZOLE (FLAGYL) 500 MG tablet Take 1 tablet (500 mg total) by mouth 2 (two) times daily. 10/15/13   [name], MD  oxyCODONE-acetaminophen (PERCOCET/ROXICET) 5-325 MG per tablet Take 1 tablet by mouth every 4 (four) hours as needed for severe pain. 10/15/13   [name], MD   BP 104/86  Pulse 94  Temp(Src) 98.7 F (37.1 C)  Resp 16  Ht 6' (1.829 m)  Wt 211 lb (95.709 kg)  BMI 28.61 kg/m2  SpO2 100% Physical Exam  Nursing note and vitals reviewed. Constitutional: He is oriented to person, place, and time. He appears well-developed and well-nourished. No distress.  HENT:  Head: Normocephalic and atraumatic.  Mouth/Throat: Oropharynx is clear and moist. No oropharyngeal exudate.  Eyes: Conjunctivae and EOM are normal. Pupils are equal, round, and reactive to  light.  Neck: Normal range of motion. Neck supple.  No meningismus.  Cardiovascular: Normal rate, regular rhythm, normal heart sounds and intact distal pulses.   No murmur heard. Pulmonary/Chest: Effort normal and breath sounds normal. No respiratory distress.  Abdominal: Soft. There is no tenderness. There is no rebound and no guarding.  Musculoskeletal: Normal range of motion. He exhibits edema. He exhibits no tenderness.  AV fistula with intact thrill and bruit left upper extremity  Lower extremities are bilaterally swollen with +3 edema. No pain to palpation TTP lumbar spine without stepoff or deformity.  Neurological: He is alert and oriented to person, place, and time. No cranial nerve deficit. He exhibits normal muscle tone. Coordination normal.  Minimal movement of right leg which is baseline. 5 out of 5 strength in the left leg. Equal grip strengths bilaterally.  Left great toe extension intact.  Skin: Skin is warm.  Psychiatric: He has a normal mood and affect. His behavior is normal.    ED Course  Procedures (including critical care time) Labs Review Labs Reviewed  CBC WITH DIFFERENTIAL - Abnormal; Notable for the following:    WBC 13.7 (*)    RBC 3.48 (*)    Hemoglobin 11.4 (*)    HCT 35.0 (*)    MCV  100.6 (*)    Platelets 422 (*)    Neutrophils Relative % 82 (*)    Neutro Abs 11.3 (*)    Lymphocytes Relative 7 (*)    Monocytes Absolute 1.3 (*)    All other components within normal limits  COMPREHENSIVE METABOLIC PANEL - Abnormal; Notable for the following:    Sodium 130 (*)    Chloride 89 (*)    Glucose, Bld 105 (*)    BUN 37 (*)    Creatinine, Ser 3.39 (*)    Calcium 10.7 (*)    Albumin 3.3 (*)    Alkaline Phosphatase 160 (*)    GFR calc non Af Amer 15 (*)    GFR calc Af Amer 17 (*)    All other components within normal limits    Imaging Review Ct Abdomen Pelvis Wo Contrast  10/15/2013   CLINICAL DATA:  Back pain  EXAM: CT ABDOMEN AND PELVIS WITHOUT CONTRAST  TECHNIQUE: Multidetector CT imaging of the abdomen and pelvis was performed following the standard protocol without IV contrast.  COMPARISON:  None.  FINDINGS: Lung bases are well visualized with mild atelectatic changes on the right.  The liver, gallbladder, adrenal glands and pancreas are within normal limits. The spleen is enlarged. Kidneys are well visualized with diffuse vascular calcifications. No obstructive changes are seen. Diverticular changes noted with evidence of mild pericolonic inflammatory change consistent with diverticulitis in the descending colon. No perforation or abscess is identified. Left lower quadrant ostomy is noted. A Hartman's pouch is seen.  The bony structures are within normal limits with the exception of a small right hip effusion. No compression deformities are seen. Degenerative changes of the lumbar spine are noted.  IMPRESSION: Diverticulitis of the proximal descending colon without abscess or perforation.   Electronically Signed   By: Inez Catalina M.D.   On: 10/15/2013 22:03   Dg Chest 2 View  10/15/2013   CLINICAL DATA:  Low back pain.  EXAM: CHEST  2 VIEW  COMPARISON:  02/08/2013.  FINDINGS: The cardiopericardial silhouette is borderline for projection. Aortic arch atherosclerosis and  tortuosity. Pulmonary vascular congestion. No focal consolidation. Blunting of the costophrenic angles is present, compatible with small pleural effusions.  IMPRESSION: Findings compatible with mild volume overload/CHF.   Electronically Signed   By: Dereck Ligas M.D.   On: 10/15/2013 20:10     EKG Interpretation None      MDM   Final diagnoses:  Diverticulitis of intestine without perforation or abscess without bleeding  Bilateral low back pain without sciatica  ESRD (end stage renal disease)   Low back pain with history of compression fracture diagnosed several weeks ago. No fall. Neuro exam at baseline with R leg weakness.  Has colostomy and is dialysis patient so difficult to assess incontinence. But urinated normally today.  No evidence of cauda equina or cord compression.   Pain improved in ED with Percocet. Patient denies any shortness of breath or chest pain. He is due for dialysis tomorrow.  Dopplers of his lower extremities are negative for clot.   CT scan reviewed with Dr. Golden Circle. No evidence of acute compression fracture. Patient does have evidence of diverticulitis.  Dosing of Cipro and Flagyl discussed with pharmacy for dialysis patient. Recommends Cipro 500 mg once daily, Flagyl 500 mg 3 times daily.  Admission versus discharge discussed with patient and family. He wishes to go home and says he is feeling better. His back pain is improved. He is at his neurological baseline. Admission offered to IV antibiotics, dialysis and pain control.  Patient and family decline stating he wants to go home and is feeling better.  First dose antibiotics given. Has dialysis tomorrow, no indication for emergent dialysis tonight.  K normal.  Some CHF but no increased work of breathing or hypoxia.  Follow up for dialysis as scheduled. Return precautions discussed.  BP 104/86  Pulse 94  Temp(Src) 98.7 F (37.1 C)  Resp 16  Ht 6' (1.829 m)  Wt 211 lb (95.709 kg)  BMI 28.61 kg/m2   SpO2 100%   [name], MD 10/16/13 0223  Addendum 10/16/13 15:45  Patient states feeling well.  Received dialysis without a problem.  Endorses some "soreness" in low back.  Epidural abscess considered but no fever or change in neuro exam.  WBC of 13 could be explained by diverticulitis. Denies fever, incontinence, new weakness. Encouraged follow up with PCP.  Return to the ED with fever, incontinence or new weakness, numbness, tingling as may need MRI if not improving.  [name], MD 10/16/13 [id number]

## 2013-10-15 NOTE — Discharge Instructions (Signed)
Diverticulitis Have dialysis tomorrow as scheduled. Take antibiotics as prescribed. Return to the ED for worsening pain, fever, nausea, vomiting or any other concerns. Diverticulitis is inflammation or infection of small pouches in your colon that form when you have a condition called diverticulosis. The pouches in your colon are called diverticula. Your colon, or large intestine, is where water is absorbed and stool is formed. Complications of diverticulitis can include:  Bleeding.  Severe infection.  Severe pain.  Perforation of your colon.  Obstruction of your colon. CAUSES  Diverticulitis is caused by bacteria. Diverticulitis happens when stool becomes trapped in diverticula. This allows bacteria to grow in the diverticula, which can lead to inflammation and infection. RISK FACTORS People with diverticulosis are at risk for diverticulitis. Eating a diet that does not include enough fiber from fruits and vegetables may make diverticulitis more likely to develop. SYMPTOMS  Symptoms of diverticulitis may include:  Abdominal pain and tenderness. The pain is normally located on the left side of the abdomen, but may occur in other areas.  Fever and chills.  Bloating.  Cramping.  Nausea.  Vomiting.  Constipation.  Diarrhea.  Blood in your stool. DIAGNOSIS  Your health care provider will ask you about your medical history and do a physical exam. You may need to have tests done because many medical conditions can cause the same symptoms as diverticulitis. Tests may include:  Blood tests.  Urine tests.  Imaging tests of the abdomen, including X-rays and CT scans. When your condition is under control, your health care provider may recommend that you have a colonoscopy. A colonoscopy can show how severe your diverticula are and whether something else is causing your symptoms. TREATMENT  Most cases of diverticulitis are mild and can be treated at home. Treatment may  include:  Taking over-the-counter pain medicines.  Following a clear liquid diet.  Taking antibiotic medicines by mouth for 7-10 days. More severe cases may be treated at a hospital. Treatment may include:  Not eating or drinking.  Taking prescription pain medicine.  Receiving antibiotic medicines through an IV tube.  Receiving fluids and nutrition through an IV tube.  Surgery. HOME CARE INSTRUCTIONS   Follow your health care provider's instructions carefully.  Follow a full liquid diet or other diet as directed by your health care provider. After your symptoms improve, your health care provider may tell you to change your diet. He or she may recommend you eat a high-fiber diet. Fruits and vegetables are good sources of fiber. Fiber makes it easier to pass stool.  Take fiber supplements or probiotics as directed by your health care provider.  Only take medicines as directed by your health care provider.  Keep all your follow-up appointments. SEEK MEDICAL CARE IF:   Your pain does not improve.  You have a hard time eating food.  Your bowel movements do not return to normal. SEEK IMMEDIATE MEDICAL CARE IF:   Your pain becomes worse.  Your symptoms do not get better.  Your symptoms suddenly get worse.  You have a fever.  You have repeated vomiting.  You have bloody or black, tarry stools. MAKE SURE YOU:   Understand these instructions.  Will watch your condition.  Will get help right away if you are not doing well or get worse. Document Released: 12/10/2004 Document Revised: 03/07/2013 Document Reviewed: 01/25/2013 [facility] Patient Information 2015 [place], [place]. This information is not intended to replace advice given to you by your health care provider. Make sure you discuss  any questions you have with your health care provider. ° °

## 2013-10-15 NOTE — Progress Notes (Signed)
VASCULAR LAB PRELIMINARY  PRELIMINARY  PRELIMINARY  PRELIMINARY  Bilateral lower extremity venous Dopplers completed.    Preliminary report:  There is no DVT or SVT noted in the bilateral lower extremities.  There is interstitial fluid noted throughout.  Waveforms are pulsatile suggestive of fluid overload.   Chantrice Hagg, RVT 10/15/2013, 7:20 PM

## 2013-10-15 NOTE — ED Notes (Signed)
Pt's family member concerned about the pt receiving IV antibiotics due to his renal function. This RN informed Dr. Wyvonnia Dusky, who states he will change the orders to PO.

## 2013-10-15 NOTE — ED Notes (Addendum)
[facility] in [place] is where pt currently lives.

## 2014-03-30 ENCOUNTER — Encounter (HOSPITAL_COMMUNITY): Payer: Self-pay | Admitting: Emergency Medicine

## 2014-03-30 ENCOUNTER — Emergency Department (HOSPITAL_COMMUNITY)
Admission: EM | Admit: 2014-03-30 | Discharge: 2014-03-31 | Disposition: A | Discharge status: Home / Self Care | Payer: Medicare Other | Source: Home / Self Care | Attending: Emergency Medicine | Admitting: Emergency Medicine

## 2014-03-30 DIAGNOSIS — K922 Gastrointestinal hemorrhage, unspecified: Secondary | ICD-10-CM | POA: Diagnosis not present

## 2014-03-30 DIAGNOSIS — K921 Melena: Secondary | ICD-10-CM

## 2014-03-30 MED ORDER — SODIUM CHLORIDE 0.9 % IV BOLUS (SEPSIS): 1000.0000 mL | Freq: Once | INTRAVENOUS | Status: DC

## 2014-03-30 NOTE — ED Notes (Signed)
Family arrived at the bedside.  States don't give him any fluid that will complicate things.  Fluid held at this time.  To notify physician.  Lab at the bedside drawing blood at this time.

## 2014-03-30 NOTE — ED Provider Notes (Signed)
CSN: [id number]     Arrival date & time 03/30/14  2302 History  .This chart was scribed for Everlene Balls, MD by Marlowe Kays, ED Scribe. This patient was seen in room B18C/B18C and the patient's care was started at 11:25 PM.   Chief Complaint  Patient presents with  . blood in colostomy    The history is provided by the patient. No language interpreter was used.    HPI Comments:  Brent Mcneil is a 81 y.o. male brought in by EMS, who presents to the Emergency Department complaining of blood in his colostomy bag that started earlier this evening. Pt was transported here from [facility]. He denies any pain other than in his right foot. He reports having dialysis every MWF. States his colostomy is from pancreatitis. Denies light headedness, dizziness or SOB. PMH of CKD, ESRD, anemia, ulcer, ulcerative esophagitis, macular degeneration, CVA, atrial flutter and atrial fibrillation.  Past Medical History  Diagnosis Date  . Chronic kidney disease     HD- M-W-F  . End stage renal disease   . Uremic encephalopathy     Metabolic  . Anemia   . Gastrointestinal bleed   . Ulcer     Peptic and gastric   . Ulcerative esophagitis   . Myeloproliferative disorder   . Macular degeneration 1990s    bilateral  . History of thrombocytosis   . Myocardial infarction 1996  . Polio age 8  . Pneumonia   . Stroke     approx 6-7 years ago, affected speech for a short time  . Atrial flutter   . Atrial fibrillation 08/2012    treated with Carvedilol, Echo done 09/22/12   Past Surgical History  Procedure Laterality Date  . Colon resection  09-2006  . Tonsillectomy  1958  . Orthopedic surgeries  as a child    due to polio  . Cataract surgery Right 2004  . Colon surgery      with Colostomy  . Av fistula placement Left 08/18/2012    Procedure: ARTERIOVENOUS (AV) FISTULA CREATION;  Surgeon: Serafina Mitchell, MD;  Location: [place];  Service: Vascular;  Laterality: Left;  Marland Kitchen Eye surgery  Right     cataract  . Cardiac catheterization  1996  . Av fistula placement Left 10/13/2012    Procedure: ARTERIOVENOUS (AV) FISTULA CREATION;  Surgeon: Serafina Mitchell, MD;  Location: [facility];  Service: Vascular;  Laterality: Left;   Family History  Problem Relation Age of Onset  . Cancer Father   . Lymphoma Brother   . Lung cancer Sister   . Emphysema Sister    History  Substance Use Topics  . Smoking status: Former Smoker    Quit date: 09/28/1956  . Smokeless tobacco: Former Systems developer    Types: Chew  . Alcohol Use: No    Review of Systems  Respiratory: Negative for shortness of breath.   Gastrointestinal: Positive for blood in stool.  Neurological: Negative for dizziness and light-headedness.  All other systems reviewed and are negative.   Allergies  Review of patient's allergies indicates no known allergies.  Home Medications   Prior to Admission medications   Medication Sig Start Date End Date Taking? Authorizing Provider  acetaminophen (TYLENOL) 325 MG tablet Take 1 tablet (325 mg total) by mouth every 6 (six) hours as needed for moderate pain (take with oxycodone until percocet arrives.  Do not take with percocet). 10/15/13   [name], MD  anagrelide Arsenio Loader) 0.5 MG capsule  Take 0.5 mg by mouth every other day.    Historical Provider, MD  aspirin 81 MG tablet Take 81 mg by mouth every other day.     Historical Provider, MD  beta carotene 25000 UNIT capsule Take 25,000 Units by mouth every morning.     Historical Provider, MD  bisacodyl (DULCOLAX) 5 MG EC tablet Take 5 mg by mouth 2 (two) times daily as needed for moderate constipation.    Historical Provider, MD  calcium acetate (PHOSLO) 667 MG capsule Take 667-1,334 mg by mouth 3 (three) times daily with meals. Take 1,334mg  (two capsules) by mouth at breakfast, and take 667mg  (one capsule) with lunch and supper.    Historical Provider, MD  carvedilol (COREG) 3.125 MG tablet Take 3.125 mg by mouth 2 (two) times daily  with a meal. Hold for SBP <110 Hold 8AM dose on Mondays Wednesdays and Fridays.    Historical Provider, MD  cinacalcet (SENSIPAR) 30 MG tablet Take 30 mg by mouth daily with supper.     Historical Provider, MD  ciprofloxacin (CIPRO) 500 MG tablet Take 1 tablet (500 mg total) by mouth daily. 10/15/13   [name], MD  finasteride (PROSCAR) 5 MG tablet Take 5 mg by mouth every morning.    Historical Provider, MD  guaiFENesin-codeine (ROBITUSSIN AC) 100-10 MG/5ML syrup Take 5 mLs by mouth every 6 (six) hours as needed for cough.    Historical Provider, MD  metroNIDAZOLE (FLAGYL) 500 MG tablet Take 1 tablet (500 mg total) by mouth 2 (two) times daily. 10/15/13   [name], MD  multivitamin (RENA-VIT) TABS tablet Take 1 tablet by mouth every morning.     Historical Provider, MD  ondansetron (ZOFRAN) 4 MG tablet Take 4 mg by mouth every 4 (four) hours as needed for vomiting.    Historical Provider, MD  oxyCODONE (OXY IR/ROXICODONE) 5 MG immediate release tablet Take 5 mg by mouth every 6 (six) hours as needed for severe pain.    Historical Provider, MD  oxyCODONE-acetaminophen (PERCOCET/ROXICET) 5-325 MG per tablet Take 1 tablet by mouth every 4 (four) hours as needed for severe pain. 10/15/13   [name], MD  pantoprazole (PROTONIX) 40 MG tablet Take 40 mg by mouth every morning.     Historical Provider, MD  traMADol (ULTRAM) 50 MG tablet Take 50 mg by mouth every 6 (six) hours as needed for pain.    Historical Provider, MD  triamcinolone cream (KENALOG) 0.1 % Apply 1 application topically as needed.    Historical Provider, MD   Triage Vitals: BP 108/46 mmHg  Pulse 97  Temp(Src) 97.9 F (36.6 C) (Oral)  Resp 24  SpO2 99% Physical Exam  Constitutional: He is oriented to person, place, and time. Vital signs are normal. He appears well-developed and well-nourished.  Non-toxic appearance. He does not appear ill. No distress.  HENT:  Head: Normocephalic and atraumatic.  Nose: Nose normal.   Mouth/Throat: Oropharynx is clear and moist. Mucous membranes are dry. No oropharyngeal exudate.  Eyes: EOM are normal. Pupils are equal, round, and reactive to light. No scleral icterus.  Jaundice in eyes bilaterally. Pale conjunctiva.  Neck: Normal range of motion. Neck supple. No tracheal deviation, no edema, no erythema and normal range of motion present. No thyroid mass and no thyromegaly present.  Cardiovascular: S1 normal, S2 normal, normal heart sounds, intact distal pulses and normal pulses.  An irregularly irregular rhythm present. Tachycardia present.  Exam reveals no gallop and no friction rub.   No murmur  heard. Pulses:      Radial pulses are 2+ on the right side, and 2+ on the left side.       Dorsalis pedis pulses are 2+ on the right side, and 2+ on the left side.  Pulmonary/Chest: Effort normal and breath sounds normal. No respiratory distress. He has no wheezes. He has no rhonchi. He has no rales.  Abdominal: Soft. Normal appearance and bowel sounds are normal. He exhibits no distension, no ascites and no mass. There is no hepatosplenomegaly. There is no tenderness. There is no rebound, no guarding and no CVA tenderness.  Colostomy in LLQ with bright red blood present.  Musculoskeletal: Normal range of motion. He exhibits no edema or tenderness.  LUE AV fistula with palpable thrill. Legs wrapped bilaterally.  Lymphadenopathy:    He has no cervical adenopathy.  Neurological: He is alert and oriented to person, place, and time. He has normal strength. No cranial nerve deficit or sensory deficit. GCS eye subscore is 4. GCS verbal subscore is 5. GCS motor subscore is 6.  Skin: Skin is warm, dry and intact. No petechiae and no rash noted. He is not diaphoretic. No erythema. No pallor.  Psychiatric: He has a normal mood and affect. His behavior is normal. Judgment normal.  Nursing note and vitals reviewed.   ED Course  Procedures (including critical care time) DIAGNOSTIC  STUDIES: Oxygen Saturation is 99% on RA, normal by my interpretation.   COORDINATION OF CARE: 11:29 PM- Will order labs. Pt verbalizes understanding and agrees to plan.  Medications - No data to display  Labs Review Labs Reviewed  CBC WITH DIFFERENTIAL - Abnormal; Notable for the following:    RBC 3.24 (*)    Hemoglobin 10.2 (*)    HCT 32.4 (*)    RDW 16.0 (*)    Lymphocytes Relative 11 (*)    Monocytes Relative 18 (*)    Monocytes Absolute 1.3 (*)    All other components within normal limits  COMPREHENSIVE METABOLIC PANEL - Abnormal; Notable for the following:    Potassium 3.3 (*)    Chloride 94 (*)    CO2 33 (*)    Glucose, Bld 143 (*)    Creatinine, Ser 1.93 (*)    Albumin 2.9 (*)    Alkaline Phosphatase 137 (*)    GFR calc non Af Amer 29 (*)    GFR calc Af Amer 34 (*)    All other components within normal limits  LIPASE, BLOOD - Abnormal; Notable for the following:    Lipase 62 (*)    All other components within normal limits  PROTIME-INR - Abnormal; Notable for the following:    Prothrombin Time 17.2 (*)    All other components within normal limits  URINALYSIS, ROUTINE W REFLEX MICROSCOPIC  TYPE AND SCREEN  ABO/RH    Imaging Review No results found.   EKG Interpretation   Date/Time:  Friday March 30 2014 23:34:11 EST Ventricular Rate:  98 PR Interval:    QRS Duration: 111 QT Interval:  403 QTC Calculation: 515 R Axis:   67 Text Interpretation:  Atrial fibrillation Nonspecific T abnormalities,  lateral leads No significant change since last tracing Confirmed by Glynn Octave [phone]) on 03/30/2014 11:37:10 PM      MDM   Final diagnoses:  None    Patient presents emergency department for blood in colostomy bag. Patient has a history of AVMs and this occurred to him several weeks ago. He has no abdominal pain  or tenderness on exam. Hemoglobin is 10.2, which is at his normal baseline. Patient denies any symptoms of lightheadedness,  dizziness, syncope. His heart rate has remained in the 90s in the emergency department. At this time the patient is safe for discharge with close follow-up with his GI physician. His daughter demonstrated understanding of this plan. They will call first thing Monday morning for an appointment. Patient also has dialysis scheduled for Monday morning where they can recheck his hemoglobin. Patient is resting comfortably in the room, nontoxic appearing. At this time his vital signs remain within his normal limits and he is safe for discharge.  I personally performed the services described in this documentation, which was scribed in my presence. The recorded information has been reviewed and is accurate.    Everlene Balls, MD 03/31/14 [phone]

## 2014-03-30 NOTE — ED Notes (Signed)
Per EMS: Pt from [facility] in [place].  Staff went to help pt change his colostomy this evening and found a large clot (approx 438ml) in the bag of bright red blood.  Pt asymptomatic except some tenderness to palpation in RLQ and LLQ.  Pt is a dialysis pt, and had his treatment today.  VS stable en route.

## 2014-03-31 LAB — CBC WITH DIFFERENTIAL/PLATELET
BASOS ABS: 0 10*3/uL (ref 0.0–0.1)
BASOS PCT: 1 % (ref 0–1)
Eosinophils Absolute: 0.1 10*3/uL (ref 0.0–0.7)
Eosinophils Relative: 1 % (ref 0–5)
HCT: 32.4 % — ABNORMAL LOW (ref 39.0–52.0)
Hemoglobin: 10.2 g/dL — ABNORMAL LOW (ref 13.0–17.0)
Lymphocytes Relative: 11 % — ABNORMAL LOW (ref 12–46)
Lymphs Abs: 0.8 10*3/uL (ref 0.7–4.0)
MCH: 31.5 pg (ref 26.0–34.0)
MCHC: 31.5 g/dL (ref 30.0–36.0)
MCV: 100 fL (ref 78.0–100.0)
MONO ABS: 1.3 10*3/uL — AB (ref 0.1–1.0)
Monocytes Relative: 18 % — ABNORMAL HIGH (ref 3–12)
Neutro Abs: 4.7 10*3/uL (ref 1.7–7.7)
Neutrophils Relative %: 69 % (ref 43–77)
Platelets: 326 10*3/uL (ref 150–400)
RBC: 3.24 MIL/uL — ABNORMAL LOW (ref 4.22–5.81)
RDW: 16 % — ABNORMAL HIGH (ref 11.5–15.5)
WBC: 6.8 10*3/uL (ref 4.0–10.5)

## 2014-03-31 LAB — COMPREHENSIVE METABOLIC PANEL
ALK PHOS: 137 U/L — AB (ref 39–117)
ALT: 11 U/L (ref 0–53)
AST: 24 U/L (ref 0–37)
Albumin: 2.9 g/dL — ABNORMAL LOW (ref 3.5–5.2)
Anion gap: 10 (ref 5–15)
BILIRUBIN TOTAL: 0.7 mg/dL (ref 0.3–1.2)
BUN: 12 mg/dL (ref 6–23)
CHLORIDE: 94 meq/L — AB (ref 96–112)
CO2: 33 mmol/L — ABNORMAL HIGH (ref 19–32)
Calcium: 9.3 mg/dL (ref 8.4–10.5)
Creatinine, Ser: 1.93 mg/dL — ABNORMAL HIGH (ref 0.50–1.35)
GFR calc Af Amer: 34 mL/min — ABNORMAL LOW (ref >90)
GFR calc non Af Amer: 29 mL/min — ABNORMAL LOW (ref >90)
Glucose, Bld: 143 mg/dL — ABNORMAL HIGH (ref 70–99)
Potassium: 3.3 mmol/L — ABNORMAL LOW (ref 3.5–5.1)
Sodium: 137 mmol/L (ref 135–145)
Total Protein: 7.1 g/dL (ref 6.0–8.3)

## 2014-03-31 LAB — PROTIME-INR
INR: 1.39 (ref 0.00–1.49)
PROTHROMBIN TIME: 17.2 s — AB (ref 11.6–15.2)

## 2014-03-31 LAB — ABO/RH: ABO/RH(D): A POS

## 2014-03-31 LAB — LIPASE, BLOOD: Lipase: 62 U/L — ABNORMAL HIGH (ref 11–59)

## 2014-03-31 LAB — TYPE AND SCREEN
ABO/RH(D): A POS
Antibody Screen: NEGATIVE

## 2014-03-31 NOTE — ED Notes (Signed)
Left on stretcher via [facility] at this time.

## 2014-03-31 NOTE — ED Notes (Signed)
Waiting for [facility].  Family remains at the bedside.  Encouraged to call for assistance as needed.

## 2014-03-31 NOTE — ED Notes (Signed)
Dr. Oni at bedside. 

## 2014-03-31 NOTE — ED Notes (Signed)
[facility] contacted to transport patient back to [facility]

## 2014-03-31 NOTE — Discharge Instructions (Signed)
Bloody Stools Mr. Janowiak, you were seen today for blood in your colostomy bag. Your hemoglobin was 10.2. It is very important to follow-up with her GI physician first thing Monday morning. If any symptoms worsen including lightheadedness or dizziness come back to emergency department immediately. Thank you. Bloody stools means there is blood in your poop (stool). It is a sign that there is a problem somewhere in the digestive system. It is important for your doctor to find the cause of your bleeding, so the problem can be treated.  HOME CARE  Only take medicine as told by your doctor.  Eat foods with fiber (prunes, bran cereals).  Drink enough fluids to keep your pee (urine) clear or pale yellow.  Sit in warm water (sitz bath) for 10 to 15 minutes as told by your doctor.  Know how to take your medicines (enemas, suppositories) if advised by your doctor.  Watch for signs that you are getting better or getting worse. GET HELP RIGHT AWAY IF:   You are not getting better.  You start to get better but then get worse again.  You have new problems.  You have severe bleeding from the place where poop comes out (rectum) that does not stop.  You throw up (vomit) blood.  You feel weak or pass out (faint).  You have a fever. MAKE SURE YOU:   Understand these instructions.  Will watch your condition.  Will get help right away if you are not doing well or get worse. Document Released: 02/18/2009 Document Revised: 05/25/2011 Document Reviewed: 07/18/2010 [facility] Patient Information 2015 [place], [place]. This information is not intended to replace advice given to you by your health care provider. Make sure you discuss any questions you have with your health care provider.

## 2014-04-01 ENCOUNTER — Encounter (HOSPITAL_COMMUNITY): Payer: Self-pay

## 2014-04-01 ENCOUNTER — Inpatient Hospital Stay (HOSPITAL_COMMUNITY)
Admission: EM | Admit: 2014-04-01 | Discharge: 2014-04-03 | DRG: 377 | Disposition: A | Discharge status: Home Health | Payer: Medicare Other | Attending: Internal Medicine | Admitting: Internal Medicine

## 2014-04-01 DIAGNOSIS — D469 Myelodysplastic syndrome, unspecified: Secondary | ICD-10-CM | POA: Diagnosis present

## 2014-04-01 DIAGNOSIS — D649 Anemia, unspecified: Secondary | ICD-10-CM | POA: Diagnosis not present

## 2014-04-01 DIAGNOSIS — H3530 Unspecified macular degeneration: Secondary | ICD-10-CM | POA: Diagnosis present

## 2014-04-01 DIAGNOSIS — Z993 Dependence on wheelchair: Secondary | ICD-10-CM | POA: Diagnosis not present

## 2014-04-01 DIAGNOSIS — Z66 Do not resuscitate: Secondary | ICD-10-CM | POA: Diagnosis present

## 2014-04-01 DIAGNOSIS — I482 Chronic atrial fibrillation, unspecified: Secondary | ICD-10-CM | POA: Diagnosis present

## 2014-04-01 DIAGNOSIS — M19011 Primary osteoarthritis, right shoulder: Secondary | ICD-10-CM | POA: Diagnosis present

## 2014-04-01 DIAGNOSIS — L97909 Non-pressure chronic ulcer of unspecified part of unspecified lower leg with unspecified severity: Secondary | ICD-10-CM | POA: Diagnosis present

## 2014-04-01 DIAGNOSIS — I120 Hypertensive chronic kidney disease with stage 5 chronic kidney disease or end stage renal disease: Secondary | ICD-10-CM | POA: Diagnosis present

## 2014-04-01 DIAGNOSIS — T402X5A Adverse effect of other opioids, initial encounter: Secondary | ICD-10-CM | POA: Diagnosis present

## 2014-04-01 DIAGNOSIS — Z933 Colostomy status: Secondary | ICD-10-CM

## 2014-04-01 DIAGNOSIS — K922 Gastrointestinal hemorrhage, unspecified: Principal | ICD-10-CM | POA: Diagnosis present

## 2014-04-01 DIAGNOSIS — R109 Unspecified abdominal pain: Secondary | ICD-10-CM

## 2014-04-01 DIAGNOSIS — D473 Essential (hemorrhagic) thrombocythemia: Secondary | ICD-10-CM | POA: Diagnosis present

## 2014-04-01 DIAGNOSIS — Z7401 Bed confinement status: Secondary | ICD-10-CM | POA: Diagnosis not present

## 2014-04-01 DIAGNOSIS — Z7982 Long term (current) use of aspirin: Secondary | ICD-10-CM | POA: Diagnosis not present

## 2014-04-01 DIAGNOSIS — K5909 Other constipation: Secondary | ICD-10-CM | POA: Diagnosis present

## 2014-04-01 DIAGNOSIS — I252 Old myocardial infarction: Secondary | ICD-10-CM | POA: Diagnosis not present

## 2014-04-01 DIAGNOSIS — M109 Gout, unspecified: Secondary | ICD-10-CM | POA: Diagnosis present

## 2014-04-01 DIAGNOSIS — Z8612 Personal history of poliomyelitis: Secondary | ICD-10-CM | POA: Diagnosis not present

## 2014-04-01 DIAGNOSIS — G8929 Other chronic pain: Secondary | ICD-10-CM | POA: Diagnosis present

## 2014-04-01 DIAGNOSIS — I4892 Unspecified atrial flutter: Secondary | ICD-10-CM | POA: Diagnosis present

## 2014-04-01 DIAGNOSIS — N186 End stage renal disease: Secondary | ICD-10-CM | POA: Diagnosis present

## 2014-04-01 DIAGNOSIS — N2581 Secondary hyperparathyroidism of renal origin: Secondary | ICD-10-CM | POA: Diagnosis present

## 2014-04-01 DIAGNOSIS — Z8673 Personal history of transient ischemic attack (TIA), and cerebral infarction without residual deficits: Secondary | ICD-10-CM

## 2014-04-01 DIAGNOSIS — F17220 Nicotine dependence, chewing tobacco, uncomplicated: Secondary | ICD-10-CM | POA: Diagnosis present

## 2014-04-01 DIAGNOSIS — Z992 Dependence on renal dialysis: Secondary | ICD-10-CM | POA: Diagnosis not present

## 2014-04-01 DIAGNOSIS — D500 Iron deficiency anemia secondary to blood loss (chronic): Secondary | ICD-10-CM | POA: Diagnosis present

## 2014-04-01 DIAGNOSIS — R234 Changes in skin texture: Secondary | ICD-10-CM | POA: Diagnosis present

## 2014-04-01 DIAGNOSIS — L8990 Pressure ulcer of unspecified site, unspecified stage: Secondary | ICD-10-CM | POA: Diagnosis present

## 2014-04-01 LAB — COMPREHENSIVE METABOLIC PANEL
ALT: 11 U/L (ref 0–53)
AST: 23 U/L (ref 0–37)
Albumin: 3 g/dL — ABNORMAL LOW (ref 3.5–5.2)
Alkaline Phosphatase: 163 U/L — ABNORMAL HIGH (ref 39–117)
Anion gap: 9 (ref 5–15)
BILIRUBIN TOTAL: 0.9 mg/dL (ref 0.3–1.2)
BUN: 33 mg/dL — ABNORMAL HIGH (ref 6–23)
CALCIUM: 9.7 mg/dL (ref 8.4–10.5)
CO2: 34 mmol/L — AB (ref 19–32)
Chloride: 97 mEq/L (ref 96–112)
Creatinine, Ser: 3.77 mg/dL — ABNORMAL HIGH (ref 0.50–1.35)
GFR calc non Af Amer: 13 mL/min — ABNORMAL LOW (ref >90)
GFR, EST AFRICAN AMERICAN: 15 mL/min — AB (ref >90)
GLUCOSE: 126 mg/dL — AB (ref 70–99)
Potassium: 3.9 mmol/L (ref 3.5–5.1)
Sodium: 140 mmol/L (ref 135–145)
Total Protein: 7.3 g/dL (ref 6.0–8.3)

## 2014-04-01 LAB — CBC
HEMATOCRIT: 33.4 % — AB (ref 39.0–52.0)
HEMOGLOBIN: 10.5 g/dL — AB (ref 13.0–17.0)
MCH: 31.4 pg (ref 26.0–34.0)
MCHC: 31.4 g/dL (ref 30.0–36.0)
MCV: 100 fL (ref 78.0–100.0)
PLATELETS: 373 10*3/uL (ref 150–400)
RBC: 3.34 MIL/uL — ABNORMAL LOW (ref 4.22–5.81)
RDW: 16.4 % — ABNORMAL HIGH (ref 11.5–15.5)
WBC: 9.2 10*3/uL (ref 4.0–10.5)

## 2014-04-01 LAB — PROTIME-INR
INR: 1.34 (ref 0.00–1.49)
PROTHROMBIN TIME: 16.7 s — AB (ref 11.6–15.2)

## 2014-04-01 LAB — TYPE AND SCREEN
ABO/RH(D): A POS
Antibody Screen: NEGATIVE

## 2014-04-01 MED ORDER — SEVELAMER CARBONATE 800 MG PO TABS
1600.0000 mg | ORAL_TABLET | Freq: Three times a day (TID) | ORAL | Status: DC
  Administered 2014-04-02 – 2014-04-03 (×3): 1600 mg via ORAL
  Filled 2014-04-01 (×6): qty 2

## 2014-04-01 MED ORDER — POLYVINYL ALCOHOL 1.4 % OP SOLN
1.0000 [drp] | OPHTHALMIC | Status: DC | PRN
  Filled 2014-04-01: qty 15

## 2014-04-01 MED ORDER — ANAGRELIDE HCL 0.5 MG PO CAPS
0.5000 mg | ORAL_CAPSULE | ORAL | Status: DC
  Filled 2014-04-01: qty 1

## 2014-04-01 MED ORDER — ONDANSETRON HCL 4 MG PO TABS: 4.0000 mg | ORAL_TABLET | Freq: Four times a day (QID) | ORAL | Status: DC | PRN

## 2014-04-01 MED ORDER — SODIUM CHLORIDE 0.9 % IV SOLN
8.0000 mg/h | INTRAVENOUS | Status: DC
  Administered 2014-04-02: 8 mg/h via INTRAVENOUS
  Filled 2014-04-01 (×3): qty 80

## 2014-04-01 MED ORDER — MORPHINE SULFATE 2 MG/ML IJ SOLN
1.0000 mg | INTRAMUSCULAR | Status: DC | PRN
  Administered 2014-04-02: 1 mg via INTRAVENOUS
  Filled 2014-04-01: qty 1

## 2014-04-01 MED ORDER — CARVEDILOL 6.25 MG PO TABS
6.2500 mg | ORAL_TABLET | Freq: Two times a day (BID) | ORAL | Status: DC
  Administered 2014-04-02 – 2014-04-03 (×2): 6.25 mg via ORAL
  Filled 2014-04-01 (×5): qty 1

## 2014-04-01 MED ORDER — ONDANSETRON HCL 4 MG/2ML IJ SOLN
4.0000 mg | Freq: Four times a day (QID) | INTRAMUSCULAR | Status: DC | PRN
  Administered 2014-04-02: 4 mg via INTRAVENOUS
  Filled 2014-04-01: qty 2

## 2014-04-01 MED ORDER — CINACALCET HCL 30 MG PO TABS
30.0000 mg | ORAL_TABLET | Freq: Every day | ORAL | Status: DC
  Administered 2014-04-02: 30 mg via ORAL
  Filled 2014-04-01 (×2): qty 1

## 2014-04-01 MED ORDER — PANTOPRAZOLE SODIUM 40 MG IV SOLR
40.0000 mg | Freq: Two times a day (BID) | INTRAVENOUS | Status: DC
Start: 2014-04-05 — End: 2014-04-02

## 2014-04-01 MED ORDER — ACETAMINOPHEN 325 MG PO TABS: 650.0000 mg | ORAL_TABLET | Freq: Four times a day (QID) | ORAL | Status: DC | PRN

## 2014-04-01 MED ORDER — SODIUM CHLORIDE 0.9 % IJ SOLN
3.0000 mL | Freq: Two times a day (BID) | INTRAMUSCULAR | Status: DC
  Administered 2014-04-02 – 2014-04-03 (×3): 3 mL via INTRAVENOUS

## 2014-04-01 MED ORDER — ACETAMINOPHEN 650 MG RE SUPP: 650.0000 mg | Freq: Four times a day (QID) | RECTAL | Status: DC | PRN

## 2014-04-01 MED ORDER — ONDANSETRON HCL 4 MG PO TABS: 4.0000 mg | ORAL_TABLET | ORAL | Status: DC | PRN

## 2014-04-01 MED ORDER — FINASTERIDE 5 MG PO TABS
5.0000 mg | ORAL_TABLET | Freq: Every day | ORAL | Status: DC
  Administered 2014-04-03: 5 mg via ORAL
  Filled 2014-04-01 (×2): qty 1

## 2014-04-01 MED ORDER — TRAMADOL HCL 50 MG PO TABS
50.0000 mg | ORAL_TABLET | Freq: Four times a day (QID) | ORAL | Status: DC | PRN
  Filled 2014-04-01: qty 1

## 2014-04-01 MED ORDER — TAPENTADOL HCL ER 150 MG PO TB12
150.0000 mg | ORAL_TABLET | Freq: Two times a day (BID) | ORAL | Status: DC
  Filled 2014-04-01 (×5): qty 1

## 2014-04-01 MED ORDER — PANTOPRAZOLE SODIUM 40 MG IV SOLR
80.0000 mg | Freq: Once | INTRAVENOUS | Status: AC
  Administered 2014-04-02: 80 mg via INTRAVENOUS
  Filled 2014-04-01: qty 80

## 2014-04-01 MED ORDER — RENA-VITE PO TABS
1.0000 | ORAL_TABLET | Freq: Every day | ORAL | Status: DC
  Administered 2014-04-03: 1 via ORAL
  Filled 2014-04-01 (×2): qty 1

## 2014-04-01 NOTE — ED Notes (Addendum)
Pt. Reports blood in stool since Friday. Was seen here Friday and released, states Hgb 10.2. Family reports blood in stool has continued in moderate amounts. Pt. Has a colostomy. Pt. Is wheelchair bound and states unable to tell is patient has increase weakness, reported one episode of dizziness. Denies dizziness at this time, denies abdominal pain. Dialysis patient, last dialysis was Friday.

## 2014-04-01 NOTE — ED Provider Notes (Signed)
Patient with blood coming out of colostomy since 03/30/2014. He reports lightheadedness yesterday, none today. Bleeding continues. On exam patient is no distress alert Glasgow Coma Score 15 abdomen nondistended normal active bowel sounds colostomy in place. Colostomy has brown stool mixed with red blood.  Orlie Dakin, MD 04/01/14 2042

## 2014-04-01 NOTE — Progress Notes (Signed)
New Admission Note  Arrival: via stretcher Mental Orientation: alert and oriented x4 Telemetry: n/a Assessment: See flow sheet. IV: RAC saline locked Safety Measures: Side rails in place, fall risk assessment complete with patient verbalizing understanding of risks associated with falls. Pt verbalizes an understanding of how to use the call bell and to call for help before getting out of bed. Call bell within reach, bed in lowest position, non-skid socks applied. [address] [place] Orientation: Pt orientation to unit, room and routine. Information packet given to patient and safety video watched.  Admission armband ID verified with patient and in place.  Family: Jenny Reichmann, daughter  Orders have been reviewed and implemented. Will continue to monitor and assist as needed.  Velora Mediate, RN 04/01/2014 10:41 PM

## 2014-04-01 NOTE — ED Provider Notes (Signed)
CSN: [id number]     Arrival date & time 04/01/14  1826 History   First MD Initiated Contact with Patient 04/01/14 1935     Chief Complaint  Patient presents with  . Blood In Stools     (Consider location/radiation/quality/duration/timing/severity/associated sxs/prior Treatment) HPI  Brent Mcneil is a 81 y.o. male with PMH of CK D on hemodialysis Monday Wednesday Friday, anemia, GI bleed, peptic and gastric ulcer, ulcerative esophagitis, thrombocytosis, A. fib, presenting with persistent blood in colostomy. Patient presented to ED on Friday with his daughter from assisted [facility] due to same symptoms. Daughter reports she sees blood from the stoma. Colostomy normally changed weekly. Her daughter had to change it yesterday due to blood. No other changes in stool. Patient's hemoglobin 10.2 on Friday. Patient denies chest pain, shortness of breath. He does endorse some lightheadedness and increase in generalized weakness. He is asymptomatic at this time. He denies any nausea, vomiting, abdominal pain. Patient on Agrylin. Daughter states patient's GI physician is Dr. Nehemiah Settle in [place]. Patient had the surgery 8 years ago due to diverticulitis.   Past Medical History  Diagnosis Date  . Chronic kidney disease     HD- M-W-F  . End stage renal disease   . Uremic encephalopathy     Metabolic  . Anemia   . Gastrointestinal bleed   . Ulcer     Peptic and gastric   . Ulcerative esophagitis   . Myeloproliferative disorder   . Macular degeneration 1990s    bilateral  . History of thrombocytosis   . Myocardial infarction 1996  . Polio age 61  . Pneumonia   . Stroke     approx 6-7 years ago, affected speech for a short time  . Atrial flutter   . Atrial fibrillation 08/2012    treated with Carvedilol, Echo done 09/22/12   Past Surgical History  Procedure Laterality Date  . Colon resection  09-2006  . Tonsillectomy  1958  . Orthopedic surgeries  as a child    due to polio  .  Cataract surgery Right 2004  . Colon surgery      with Colostomy  . Av fistula placement Left 08/18/2012    Procedure: ARTERIOVENOUS (AV) FISTULA CREATION;  Surgeon: Serafina Mitchell, MD;  Location: [place];  Service: Vascular;  Laterality: Left;  Marland Kitchen Eye surgery Right     cataract  . Cardiac catheterization  1996  . Av fistula placement Left 10/13/2012    Procedure: ARTERIOVENOUS (AV) FISTULA CREATION;  Surgeon: Serafina Mitchell, MD;  Location: [facility];  Service: Vascular;  Laterality: Left;   Family History  Problem Relation Age of Onset  . Cancer Father   . Lymphoma Brother   . Lung cancer Sister   . Emphysema Sister    History  Substance Use Topics  . Smoking status: Former Smoker    Quit date: 09/28/1956  . Smokeless tobacco: Former Systems developer    Types: Chew  . Alcohol Use: No    Review of Systems 10 Systems reviewed and are negative for acute change except as noted in the HPI.    Allergies  Review of patient's allergies indicates no known allergies.  Home Medications   Prior to Admission medications   Medication Sig Start Date End Date Taking? Authorizing Provider  acetaminophen (TYLENOL) 500 MG tablet Take 1,000 mg by mouth every 6 (six) hours as needed for fever (pain).   Yes Historical Provider, MD  anagrelide (AGRYLIN) 0.5 MG capsule Take  0.5 mg by mouth every other day.   Yes Historical Provider, MD  aspirin EC 81 MG tablet Take 81 mg by mouth every other day.   Yes Historical Provider, MD  beta carotene 25000 UNIT capsule Take 25,000 Units by mouth daily.    Yes Historical Provider, MD  carvedilol (COREG) 6.25 MG tablet Take 6.25 mg by mouth See admin instructions. Take 1 tablet (6.25 mg) twice daily, except: hold morning dose on Monday, Wednesday and Friday if SBP<110   Yes Historical Provider, MD  cinacalcet (SENSIPAR) 30 MG tablet Take 30 mg by mouth daily with supper.    Yes Historical Provider, MD  finasteride (PROSCAR) 5 MG tablet Take 5 mg by mouth daily.    Yes  Historical Provider, MD  indomethacin (INDOCIN) 25 MG capsule Take 25 mg by mouth every other day. bedtime   Yes Historical Provider, MD  lubiprostone (AMITIZA) 8 MCG capsule Take 8 mcg by mouth daily with breakfast.   Yes Historical Provider, MD  multivitamin (RENA-VIT) TABS tablet Take 1 tablet by mouth daily.    Yes Historical Provider, MD  Naloxegol Oxalate 25 MG TABS Take 25 mg by mouth daily.   Yes Historical Provider, MD  ondansetron (ZOFRAN) 4 MG tablet Take 4 mg by mouth every 4 (four) hours as needed for vomiting.   Yes Historical Provider, MD  pantoprazole (PROTONIX) 40 MG tablet Take 40 mg by mouth daily.    Yes Historical Provider, MD  polyvinyl alcohol (LIQUIFILM TEARS) 1.4 % ophthalmic solution Place 1 drop into both eyes every 2 (two) hours as needed for dry eyes.   Yes Historical Provider, MD  PRESCRIPTION MEDICATION Apply 1 application topically 2 (two) times daily as needed (rash). Nystatin mixed with triamcinolone 0.1% cream 1:1   Yes Historical Provider, MD  sevelamer carbonate (RENVELA) 800 MG tablet Take 1,600 mg by mouth 3 (three) times daily with meals.   Yes Historical Provider, MD  Tapentadol HCl 150 MG TB12 Take 150 mg by mouth every 12 (twelve) hours.   Yes Historical Provider, MD  traMADol (ULTRAM) 50 MG tablet Take 50 mg by mouth every 6 (six) hours as needed (pain).    Yes Historical Provider, MD  acetaminophen (TYLENOL) 325 MG tablet Take 1 tablet (325 mg total) by mouth every 6 (six) hours as needed for moderate pain (take with oxycodone until percocet arrives.  Do not take with percocet). Patient not taking: Reported on 04/01/2014 10/15/13   Brent Essex, MD  ciprofloxacin (CIPRO) 500 MG tablet Take 1 tablet (500 mg total) by mouth daily. Patient not taking: Reported on 04/01/2014 10/15/13   Brent Essex, MD  metroNIDAZOLE (FLAGYL) 500 MG tablet Take 1 tablet (500 mg total) by mouth 2 (two) times daily. Patient not taking: Reported on 04/01/2014 10/15/13   Brent Essex, MD  oxyCODONE-acetaminophen (PERCOCET/ROXICET) 5-325 MG per tablet Take 1 tablet by mouth every 4 (four) hours as needed for severe pain. Patient not taking: Reported on 04/01/2014 10/15/13   Brent Essex, MD   BP 115/59 mmHg  Pulse 102  Temp(Src) 98.1 F (36.7 C) (Oral)  Resp 18  Ht 6' (1.829 m)  Wt 206 lb (93.441 kg)  BMI 27.93 kg/m2  SpO2 94% Physical Exam  Constitutional: He appears well-developed and well-nourished. No distress.  HENT:  Head: Normocephalic and atraumatic.  Mouth/Throat: Oropharynx is clear and moist.  Eyes: Conjunctivae and EOM are normal. Right eye exhibits no discharge. Left eye exhibits no discharge.  Jaundice  Cardiovascular: Normal  rate, regular rhythm and normal heart sounds.   Pulmonary/Chest: Effort normal and breath sounds normal. No respiratory distress. He has no wheezes.  Abdominal: Soft. Bowel sounds are normal. He exhibits no distension. There is no tenderness.  Colostomy in left lower quadrant without surrounding tenderness or erythema. Frank red blood in colostomy.  Neurological: He is alert. He exhibits normal muscle tone. Coordination normal.  Skin: Skin is warm and dry. He is not diaphoretic.  Nursing note and vitals reviewed.   ED Course  Procedures (including critical care time) Labs Review Labs Reviewed  CBC - Abnormal; Notable for the following:    RBC 3.34 (*)    Hemoglobin 10.5 (*)    HCT 33.4 (*)    RDW 16.4 (*)    All other components within normal limits  COMPREHENSIVE METABOLIC PANEL - Abnormal; Notable for the following:    CO2 34 (*)    Glucose, Bld 126 (*)    BUN 33 (*)    Creatinine, Ser 3.77 (*)    Albumin 3.0 (*)    Alkaline Phosphatase 163 (*)    GFR calc non Af Amer 13 (*)    GFR calc Af Amer 15 (*)    All other components within normal limits  PROTIME-INR - Abnormal; Notable for the following:    Prothrombin Time 16.7 (*)    All other components within normal limits  TYPE AND SCREEN    Imaging  Review No results found.   EKG Interpretation None      MDM   Final diagnoses:  Gastrointestinal hemorrhage, unspecified gastritis, unspecified gastrointestinal hemorrhage type  Anemia, unspecified anemia type  Colostomy in place   Patient with blood in colostomy since 03/30/2013. Patient reported lightheadedness couple days ago but is currently asymptomatic. No chest pain, shortness of breath. Pt on Agrylin for thrombocytosis. Pt also a dialysis pt. Patient with mild tachycardia but daughter states this is normal for him. No abdominal tenderness and no evidence of infection to colostomy. There is frank red blood as well as stool in colostomy. Patient's hemoglobin 10.5 and is at his baseline. Concern for persistent GI bleed that at times is symptomatic. Consult to GI spoke with Dr. Elmo Putt who will see patient in the hospital tomorrow. Consult to hospitalist who agreed to evaluate and admit patient.   Discussed all results and patient verbalizes understanding and agrees with plan.  This is a shared patient. This patient was discussed with the physician, Dr. Winfred Leeds who saw and evaluated the patient and agrees with the plan.     Pura Spice, PA-C 04/01/14 [place], MD 04/02/14 [phone]

## 2014-04-02 ENCOUNTER — Inpatient Hospital Stay (HOSPITAL_COMMUNITY): Payer: Medicare Other

## 2014-04-02 ENCOUNTER — Encounter (HOSPITAL_COMMUNITY): Payer: Self-pay | Admitting: Radiology

## 2014-04-02 DIAGNOSIS — K5731 Diverticulosis of large intestine without perforation or abscess with bleeding: Secondary | ICD-10-CM

## 2014-04-02 DIAGNOSIS — Z933 Colostomy status: Secondary | ICD-10-CM

## 2014-04-02 DIAGNOSIS — K922 Gastrointestinal hemorrhage, unspecified: Secondary | ICD-10-CM | POA: Insufficient documentation

## 2014-04-02 DIAGNOSIS — D649 Anemia, unspecified: Secondary | ICD-10-CM | POA: Insufficient documentation

## 2014-04-02 DIAGNOSIS — D62 Acute posthemorrhagic anemia: Secondary | ICD-10-CM

## 2014-04-02 LAB — BASIC METABOLIC PANEL
Anion gap: 3 — ABNORMAL LOW (ref 5–15)
BUN: 36 mg/dL — ABNORMAL HIGH (ref 6–23)
CHLORIDE: 97 meq/L (ref 96–112)
CO2: 34 mmol/L — AB (ref 19–32)
CREATININE: 3.96 mg/dL — AB (ref 0.50–1.35)
Calcium: 9.3 mg/dL (ref 8.4–10.5)
GFR calc Af Amer: 14 mL/min — ABNORMAL LOW (ref >90)
GFR calc non Af Amer: 12 mL/min — ABNORMAL LOW (ref >90)
GLUCOSE: 98 mg/dL (ref 70–99)
Potassium: 3.8 mmol/L (ref 3.5–5.1)
SODIUM: 134 mmol/L — AB (ref 135–145)

## 2014-04-02 LAB — CBC
HCT: 31.5 % — ABNORMAL LOW (ref 39.0–52.0)
HCT: 28.9 % — ABNORMAL LOW (ref 39.0–52.0)
HCT: 29.9 % — ABNORMAL LOW (ref 39.0–52.0)
HEMATOCRIT: 30.7 % — AB (ref 39.0–52.0)
HEMOGLOBIN: 9.9 g/dL — AB (ref 13.0–17.0)
HEMOGLOBIN: 9.2 g/dL — AB (ref 13.0–17.0)
HEMOGLOBIN: 9.7 g/dL — AB (ref 13.0–17.0)
Hemoglobin: 9.6 g/dL — ABNORMAL LOW (ref 13.0–17.0)
MCH: 31.9 pg (ref 26.0–34.0)
MCH: 31.6 pg (ref 26.0–34.0)
MCH: 31.4 pg (ref 26.0–34.0)
MCH: 32 pg (ref 26.0–34.0)
MCHC: 31.4 g/dL (ref 30.0–36.0)
MCHC: 31.8 g/dL (ref 30.0–36.0)
MCHC: 31.6 g/dL (ref 30.0–36.0)
MCHC: 32.1 g/dL (ref 30.0–36.0)
MCV: 101.6 fL — AB (ref 78.0–100.0)
MCV: 99.3 fL (ref 78.0–100.0)
MCV: 99.4 fL (ref 78.0–100.0)
MCV: 99.7 fL (ref 78.0–100.0)
PLATELETS: 359 10*3/uL (ref 150–400)
PLATELETS: 352 10*3/uL (ref 150–400)
PLATELETS: 358 10*3/uL (ref 150–400)
Platelets: 389 10*3/uL (ref 150–400)
RBC: 3.1 MIL/uL — ABNORMAL LOW (ref 4.22–5.81)
RBC: 2.91 MIL/uL — AB (ref 4.22–5.81)
RBC: 3.09 MIL/uL — AB (ref 4.22–5.81)
RBC: 3 MIL/uL — ABNORMAL LOW (ref 4.22–5.81)
RDW: 16.5 % — ABNORMAL HIGH (ref 11.5–15.5)
RDW: 16.7 % — ABNORMAL HIGH (ref 11.5–15.5)
RDW: 16.7 % — ABNORMAL HIGH (ref 11.5–15.5)
RDW: 16.9 % — AB (ref 11.5–15.5)
WBC: 10.1 10*3/uL (ref 4.0–10.5)
WBC: 10.3 10*3/uL (ref 4.0–10.5)
WBC: 9.3 10*3/uL (ref 4.0–10.5)
WBC: 10.3 10*3/uL (ref 4.0–10.5)

## 2014-04-02 LAB — GLUCOSE, CAPILLARY
GLUCOSE-CAPILLARY: 103 mg/dL — AB (ref 70–99)
GLUCOSE-CAPILLARY: 86 mg/dL (ref 70–99)
Glucose-Capillary: 113 mg/dL — ABNORMAL HIGH (ref 70–99)
Glucose-Capillary: 86 mg/dL (ref 70–99)
Glucose-Capillary: 86 mg/dL (ref 70–99)

## 2014-04-02 LAB — LACTIC ACID, PLASMA: LACTIC ACID, VENOUS: 1 mmol/L (ref 0.5–2.2)

## 2014-04-02 MED ORDER — NA FERRIC GLUC CPLX IN SUCROSE 12.5 MG/ML IV SOLN: 62.5000 mg | INTRAVENOUS | Status: DC

## 2014-04-02 MED ORDER — DARBEPOETIN ALFA 60 MCG/0.3ML IJ SOSY: 60.0000 ug | PREFILLED_SYRINGE | INTRAMUSCULAR | Status: DC

## 2014-04-02 MED ORDER — IOHEXOL 300 MG/ML  SOLN
100.0000 mL | Freq: Once | INTRAMUSCULAR | Status: AC | PRN
  Administered 2014-04-02: 100 mL via INTRAVENOUS

## 2014-04-02 MED ORDER — PANTOPRAZOLE SODIUM 40 MG IV SOLR
40.0000 mg | Freq: Two times a day (BID) | INTRAVENOUS | Status: DC
  Administered 2014-04-02: 40 mg via INTRAVENOUS
  Filled 2014-04-02 (×4): qty 40

## 2014-04-02 NOTE — Progress Notes (Signed)
Orthopedic Tech Progress Note Patient Details:  Brent Mcneil [birth year] [id number]  Ortho Devices Type of Ortho Device: Louretta Parma boot Ortho Device/Splint Location: bilateral Ortho Device/Splint Interventions: Application   Hildred Priest 04/02/2014, 1:39 PM

## 2014-04-02 NOTE — Consult Note (Signed)
WOC wound consult note Reason for Consult: BLE wounds Wound type: Full thickness venous stasis wound noted to right dorsal area of foot near ankle, DTI noted to right lateral foot near toes, healed left venous stasis ulcer to left lateral foot near ankle. Pressure Ulcer POA: Yes, DTI WAS PRESENT ON ADMISSION Measurement: Right lateral foot DTI: 3cmX1cm; Right dorsal area of foot near ankle: 1cmX1cmX0.22cm; healed area to left lateral foot near ankle: 1cmX1.5cm Wound bed: Right lateral foot DTI wound bed not visible, area purple; right dorsal area of foot near ankle: wound bed is covered in yellow eschar, no new drainage noted to area however a small amount of old yellow drainage was noted to the removed dressing; left lateral foot near ankle--area is healed, pink in color with small scab noted to center of wound. Drainage (amount, consistency, odor): No new drainage noted to areas; small amount of old yellow drainage noted to dressing that was removed from right dorsal area of foot. Periwound: Intact, dry Dressing procedure/placement/frequency: Patient states that these wounds have been present for a while now and that a nurse changes his dressings at home every Thursday. He also endorses that the right unna boot is on too tight. Upon assessment, Unna boots were in place--these were removed to assess area.  Full thickness venous stasis ulcer noted to dorsum of foot near ankle on the right side, with yellow eschar covering wound bed; no drainage noted.  DTI also noted to right lateral aspect of the foot, likely developed from unna boot being placed on too tightly.  Foam dressings were applied to the right foot wounds.  Left foot wound was left open to air as the area has healed at this time.  BLE are still edematous.  Ortho tech was consulted to reapply unna boots, recommend ortho tech to change unna boots every Monday (pt was receiving once a week application of unna boots as an outpatient.)  Orson Gear, RN-BSN, Graduate Student Please re-consult if further assistance is needed.  Thank-you,  Julien Girt MSN, [place], [place], [place], [place]

## 2014-04-02 NOTE — Progress Notes (Signed)
Pt drank x2 contrast solutions.

## 2014-04-02 NOTE — Progress Notes (Signed)
[facility]                                                                              Patient Demographics  Brent Mcneil, is a 81 y.o. male, DOB - Apr 18, 1923, DTO:[id number]  Admit date - 04/01/2014   Admitting Physician Rise Patience, MD  Outpatient Primary MD for the patient is HAGUE, Rosalyn Charters, MD  LOS - 1   Chief Complaint  Patient presents with  . Blood In Stools      HPI on 04/02/2014 by Dr. Gean Birchwood Brent Mcneil is a 81 y.o. 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 Patient also initially noticed some left lower quadrant pain which has resolved at this time. Denies any fever chills or any recent use of antibiotics. Denies any nausea vomiting. Patient's hemoglobin has been at the baseline and has been hemodynamically stable though patient did feel some dizziness. Denies any chest pain or shortness of breath. Patient is on aspirin for his atrial fibrillation which patient takes it every other day. Patient also on NSAIDs for gout. Patient states the bleeding is more frank blood rather than melena. It is not continuous but intermittent mixed with normal stools.    Assessment & Plan   GI bleed with history of AVM -Gastroenterology consulted and appreciated -Continue protonix 40mg  IV BID -Hemoglobin currently remain stable, will continue to monitor -no active signs of bleeding in colostomy  End-stage renal disease -Nephrology consult and appreciated -Patient dialyzes Monday, Wednesday, Friday  Chronic atrial fibrillation -Rate controlled -Patient is not anticoagulant candidate due to history of AVMs -Continue Coreg with holding parameters -Aspirin held  Myelodysplastic  syndrome -Continue Anagrelide  Chronic anemia -Hemoglobin currently stable  -continue to monitor CBC  Bilateral lower extremity wounds -Wound care consulted and appreciated  History of polio with right lower extremity weakness -Patient become more bedbound the last year -orthopedic tech- unna boot  Code Status: DNR  Family Communication: Daughter via phone.   Disposition Plan: Admitted  Time Spent in minutes   30 minutes  Procedures  None  Consults   Nephrology Gastroenterology  DVT Prophylaxis  SCDs  Lab Results  Component Value Date   PLT 389 04/02/2014    Medications  Scheduled Meds: . anagrelide  0.5 mg Oral QODAY  . carvedilol  6.25 mg Oral BID  . cinacalcet  30 mg Oral Q supper  . [START ON 04/04/2014] darbepoetin (ARANESP) injection - DIALYSIS  60 mcg Intravenous Q Wed-HD  . finasteride  5 mg Oral Daily  . multivitamin  1 tablet Oral Daily  . pantoprazole (PROTONIX) IV  40 mg Intravenous Q12H  . sevelamer carbonate  1,600 mg Oral TID WC  . sodium chloride  3 mL Intravenous Q12H  . Tapentadol HCl  150 mg Oral Q12H   Continuous Infusions:  PRN Meds:.acetaminophen **OR** acetaminophen, morphine injection, ondansetron **OR** ondansetron (ZOFRAN) IV, ondansetron, polyvinyl alcohol, traMADol  Antibiotics    Anti-infectives    None  Subjective:   Beacher May seen and examined today.  Patient states that he has seen bright red blood in his bag, however, not at this time.  He denies chest pain, shortness of breath, dizziness, abdominal pain.  Objective:   Filed Vitals:   04/01/14 2100 04/01/14 2215 04/02/14 0507 04/02/14 0847  BP: 115/59 107/62 100/56 93/52  Pulse: 102 110 102 114  Temp:  97.6 F (36.4 C) 98.1 F (36.7 C) 97.8 F (36.6 C)  TempSrc:  Oral Oral Oral  Resp:  18 18 17   Height:  6' (1.829 m)    Weight:  94.2 kg (207 lb 10.8 oz)    SpO2:  99% 98% 90%    Wt Readings from Last 3 Encounters:  04/01/14 94.2 kg (207 lb  10.8 oz)  10/15/13 95.709 kg (211 lb)  02/11/13 100.3 kg (221 lb 1.9 oz)     Intake/Output Summary (Last 24 hours) at 04/02/14 1230 Last data filed at 04/02/14 1017  Gross per 24 hour  Intake      0 ml  Output    500 ml  Net   -500 ml    Exam  General: Well developed, well nourished, NAD, appears stated age  HEENT: NCAT, mucous membranes moist.   Cardiovascular: S1 S2 auscultated, irregular   Respiratory: Clear to auscultation bilaterally with equal chest rise  Abdomen: Soft, nontender, nondistended, + bowel sounds, +colostomy  Extremities: warm dry without cyanosis clubbing or edema  Neuro: AAOx3, nonfocal- does not move RLE due to polio  Skin:  Multiple wounds on LE-dressed  Psych: Normal affect and demeanor with intact judgement and insight  Data Review   Micro Results No results found for this or any previous visit (from the past 240 hour(s)).  Radiology Reports Ct Abdomen Pelvis W Contrast  04/02/2014   CLINICAL DATA:  Bleeding from colostomy bag for 2 days. History of myelodysplastic syndrome and end-stage renal disease.  EXAM: CT ABDOMEN AND PELVIS WITH CONTRAST  TECHNIQUE: Multidetector CT imaging of the abdomen and pelvis was performed using the standard protocol following bolus administration of intravenous contrast.  CONTRAST:  166mL OMNIPAQUE IOHEXOL 300 MG/ML  SOLN  COMPARISON:  10/15/2013  FINDINGS: BODY WALL: Progressive gynecomastia on the right, partially visualized. No masslike component identified.  LOWER CHEST: Small pleural effusions, right greater than left, with atelectasis.  ABDOMEN/PELVIS:  Liver: No focal abnormality.  Biliary: Pericholecystic edema and/or gallbladder wall thickening that appears reactive. No gallbladder distention suggestive of obstruction.  Pancreas: Unremarkable.  Spleen: Splenomegaly which is chronic and stable. Maximal transverse dimension is 16 cm. Maximal axial thickness is 9 cm. There are 2 hypervascular masses within the  upper spleen which measure up to 3 cm. These are stable from 2014 and thus benign.  Adrenals: Unremarkable.  Kidneys and ureters: Bilateral renal cortical thinning. There is an interpolar renal lesion on the left measuring 2.6 cm. Although mildly dense compared to water, there is no evidence of delayed enhancement or de enhancement and the lesion appeared cystic on ultrasound 05/23/2013. This is likely a proteinaceous or hemorrhagic cyst. There is a dense, possibly enhanced cortical lesion from the lower pole right kidney which is more suspicious. This was not seen on [facility] ultrasound 05/23/2013.  Bladder: Circumferential wall thickening. Further evaluation is limited due to decompressed state.  Reproductive: Moderate enlargement of the prostate gland.  Bowel: Descending colostomy which is intact and patent. There is diffuse colonic diverticulosis, with inflammation and wall thickening at the level of the  sigmoid/ descending junction. No abscess or free perforation. There is no hyper enhancement of the bowel wall in this patient with history of AVM. Detection of intraluminal contrast extravasation is not possible due to oral contrast. Negative appendix.  Retroperitoneum: No mass or adenopathy.  Peritoneum: No ascites or pneumoperitoneum.  Vascular: No acute abnormality.  OSSEOUS: Marked atrophy of muscles around the right hip compatible with right lower extremity paresis.  L4 compression fracture with 75- 50% height loss. The fracture line is readily visible through the central body, exiting the posterior superior endplate. There is no subluxation or posterior element fracturing. No bony retropulsion.  IMPRESSION: 1. Colonic diverticulitis at the descending sigmoid junction. No abscess or free perforation. 2. Acute/unhealed L4 compression fracture. 3. 12 mm lesion in the right Kidney, neoplasm versus complicated cyst. Renal protocol CT with contrast would be the most definitive examination in this patient  with end-stage renal disease, but given patient comorbidities and lack of growth since 2014, surveillance imaging would also be reasonable. 4. Multiple incidental findings noted above.   Electronically Signed   By: Jorje Guild M.D.   On: 04/02/2014 05:53    CBC  Recent Labs Lab 03/30/14 2349 04/01/14 1849 04/02/14 0104 04/02/14 0500 04/02/14 0751 04/02/14 1145  WBC 6.8 9.2 10.1 10.3 9.3 10.3  HGB 10.2* 10.5* 9.9* 9.2* 9.7* 9.6*  HCT 32.4* 33.4* 31.5* 28.9* 30.7* 29.9*  PLT 326 373 359 352 358 389  MCV 100.0 100.0 101.6* 99.3 99.4 99.7  MCH 31.5 31.4 31.9 31.6 31.4 32.0  MCHC 31.5 31.4 31.4 31.8 31.6 32.1  RDW 16.0* 16.4* 16.5* 16.7* 16.7* 16.9*  LYMPHSABS 0.8  --   --   --   --   --   MONOABS 1.3*  --   --   --   --   --   EOSABS 0.1  --   --   --   --   --   BASOSABS 0.0  --   --   --   --   --     Chemistries   Recent Labs Lab 03/30/14 2349 04/01/14 1849 04/02/14 0500  NA 137 140 134*  K 3.3* 3.9 3.8  CL 94* 97 97  CO2 33* 34* 34*  GLUCOSE 143* 126* 98  BUN 12 33* 36*  CREATININE 1.93* 3.77* 3.96*  CALCIUM 9.3 9.7 9.3  AST 24 23  --   ALT 11 11  --   ALKPHOS 137* 163*  --   BILITOT 0.7 0.9  --    ------------------------------------------------------------------------------------------------------------------ estimated creatinine clearance is 15.4 mL/min (by C-G formula based on Cr of 3.96). ------------------------------------------------------------------------------------------------------------------ No results for input(s): HGBA1C in the last 72 hours. ------------------------------------------------------------------------------------------------------------------ No results for input(s): CHOL, HDL, LDLCALC, TRIG, CHOLHDL, LDLDIRECT in the last 72 hours. ------------------------------------------------------------------------------------------------------------------ No results for input(s): TSH, T4TOTAL, T3FREE, THYROIDAB in the last 72  hours.  Invalid input(s): FREET3 ------------------------------------------------------------------------------------------------------------------ No results for input(s): VITAMINB12, FOLATE, FERRITIN, TIBC, IRON, RETICCTPCT in the last 72 hours.  Coagulation profile  Recent Labs Lab 03/30/14 2349 04/01/14 1958  INR 1.39 1.34    No results for input(s): DDIMER in the last 72 hours.  Cardiac Enzymes No results for input(s): CKMB, TROPONINI, MYOGLOBIN in the last 168 hours.  Invalid input(s): CK ------------------------------------------------------------------------------------------------------------------ Invalid input(s): POCBNP    Doratha Mcswain D.O. on 04/02/2014 at 12:30 PM  Between 7am to 7pm - Pager - [phone]  After 7pm go to [email or web address] - password [email or web address]  And look for the night coverage  person covering for me after hours  [facility] Office  [phone]

## 2014-04-02 NOTE — H&P (Addendum)
[facility] History and Physical  Brent Mcneil PPI:[id number] DOB: 1924-03-01 DOA: 04/01/2014  Referring physician: ER physician. PCP: Bonnita Nasuti, MD   Chief Complaint: Bleeding through the colostomy.  HPI: Brent Mcneil is a 81 y.o. 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 Patient also initially noticed some left lower quadrant pain which has resolved at this time. Denies any fever chills or any recent use of antibiotics. Denies any nausea vomiting. Patient's hemoglobin has been at the baseline and has been hemodynamically stable though patient did feel some dizziness. Denies any chest pain or shortness of breath. Patient is on aspirin for his atrial fibrillation which patient takes it every other day. Patient also on NSAIDs for gout. Patient states the bleeding is more frank blood rather than melena. It is not continuous but intermittent mixed with normal stools.   Review of Systems: As presented in the history of presenting illness, rest negative.  Past Medical History  Diagnosis Date  . Chronic kidney disease     HD- M-W-F  . End stage renal disease   . Uremic encephalopathy     Metabolic  . Anemia   . Gastrointestinal bleed   . Ulcer     Peptic and gastric   . Ulcerative esophagitis   . Myeloproliferative disorder   . Macular degeneration 1990s    bilateral  . History of thrombocytosis   . Myocardial infarction 1996  . Polio age 66  . Pneumonia   . Stroke     approx 6-7 years ago, affected speech for a short time  . Atrial flutter   . Atrial fibrillation 08/2012    treated with Carvedilol, Echo done 09/22/12   Past Surgical History  Procedure Laterality Date  . Colon resection  09-2006  .  Tonsillectomy  1958  . Orthopedic surgeries  as a child    due to polio  . Cataract surgery Right 2004  . Colon surgery      with Colostomy  . Av fistula placement Left 08/18/2012    Procedure: ARTERIOVENOUS (AV) FISTULA CREATION;  Surgeon: Serafina Mitchell, MD;  Location: [facility];  Service: Vascular;  Laterality: Left;  Marland Kitchen Eye surgery Right     cataract  . Cardiac catheterization  1996  . Av fistula placement Left 10/13/2012    Procedure: ARTERIOVENOUS (AV) FISTULA CREATION;  Surgeon: Serafina Mitchell, MD;  Location: [facility];  Service: Vascular;  Laterality: Left;   Social History:  reports that he quit smoking about 57 years ago. He has quit using smokeless tobacco. His smokeless tobacco use included Chew. He reports that he does not drink alcohol or use illicit drugs. Where does patient live nursing home. Can patient participate in ADLs? No.  No Known Allergies  Family History:  Family History  Problem Relation Age of Onset  . Cancer Father   . Lymphoma Brother   . Lung cancer Sister   . Emphysema Sister       Prior to Admission medications   Medication Sig Start Date End Date Taking? Authorizing Provider  acetaminophen (TYLENOL) 500 MG tablet Take 1,000 mg by mouth every 6 (six) hours as needed for fever (pain).   Yes Historical Provider, MD  anagrelide (AGRYLIN) 0.5 MG capsule Take 0.5 mg by mouth  every other day.   Yes Historical Provider, MD  aspirin EC 81 MG tablet Take 81 mg by mouth every other day.   Yes Historical Provider, MD  beta carotene 25000 UNIT capsule Take 25,000 Units by mouth daily.    Yes Historical Provider, MD  carvedilol (COREG) 6.25 MG tablet Take 6.25 mg by mouth See admin instructions. Take 1 tablet (6.25 mg) twice daily, except: hold morning dose on Monday, Wednesday and Friday if SBP<110   Yes Historical Provider, MD  cinacalcet (SENSIPAR) 30 MG tablet Take 30 mg by mouth daily with supper.    Yes Historical Provider, MD  finasteride (PROSCAR) 5 MG tablet  Take 5 mg by mouth daily.    Yes Historical Provider, MD  indomethacin (INDOCIN) 25 MG capsule Take 25 mg by mouth every other day. bedtime   Yes Historical Provider, MD  lubiprostone (AMITIZA) 8 MCG capsule Take 8 mcg by mouth daily with breakfast.   Yes Historical Provider, MD  multivitamin (RENA-VIT) TABS tablet Take 1 tablet by mouth daily.    Yes Historical Provider, MD  Naloxegol Oxalate 25 MG TABS Take 25 mg by mouth daily.   Yes Historical Provider, MD  ondansetron (ZOFRAN) 4 MG tablet Take 4 mg by mouth every 4 (four) hours as needed for vomiting.   Yes Historical Provider, MD  pantoprazole (PROTONIX) 40 MG tablet Take 40 mg by mouth daily.    Yes Historical Provider, MD  polyvinyl alcohol (LIQUIFILM TEARS) 1.4 % ophthalmic solution Place 1 drop into both eyes every 2 (two) hours as needed for dry eyes.   Yes Historical Provider, MD  PRESCRIPTION MEDICATION Apply 1 application topically 2 (two) times daily as needed (rash). Nystatin mixed with triamcinolone 0.1% cream 1:1   Yes Historical Provider, MD  sevelamer carbonate (RENVELA) 800 MG tablet Take 1,600 mg by mouth 3 (three) times daily with meals.   Yes Historical Provider, MD  Tapentadol HCl 150 MG TB12 Take 150 mg by mouth every 12 (twelve) hours.   Yes Historical Provider, MD  traMADol (ULTRAM) 50 MG tablet Take 50 mg by mouth every 6 (six) hours as needed (pain).    Yes Historical Provider, MD  acetaminophen (TYLENOL) 325 MG tablet Take 1 tablet (325 mg total) by mouth every 6 (six) hours as needed for moderate pain (take with oxycodone until percocet arrives.  Do not take with percocet). Patient not taking: Reported on 04/01/2014 10/15/13   Brent Essex, MD  ciprofloxacin (CIPRO) 500 MG tablet Take 1 tablet (500 mg total) by mouth daily. Patient not taking: Reported on 04/01/2014 10/15/13   Brent Essex, MD  metroNIDAZOLE (FLAGYL) 500 MG tablet Take 1 tablet (500 mg total) by mouth 2 (two) times daily. Patient not taking: Reported  on 04/01/2014 10/15/13   Brent Essex, MD  oxyCODONE-acetaminophen (PERCOCET/ROXICET) 5-325 MG per tablet Take 1 tablet by mouth every 4 (four) hours as needed for severe pain. Patient not taking: Reported on 04/01/2014 10/15/13   Brent Essex, MD    Physical Exam: Filed Vitals:   04/01/14 2045 04/01/14 2048 04/01/14 2100 04/01/14 2215  BP: 115/51 115/51 115/59 107/62  Pulse: 108 104 102 110  Temp:    97.6 F (36.4 C)  TempSrc:    Oral  Resp:  18  18  Height:    6' (1.829 m)  Weight:    94.2 kg (207 lb 10.8 oz)  SpO2:  94%  99%     General:  Well-developed and nourished.  Eyes: Anicteric  no pallor.  ENT: No discharge from the ears eyes nose or mouth.  Neck: No mass felt.  Cardiovascular: S1 and S2 heard.  Respiratory: No rhonchi or crepitations.  Abdomen: Soft colostomy bag seen at this time and once any active bleed. Nontender.  Skin: Has multiple wounds of the lower extremity wounds which are dressed.  Musculoskeletal: Bilateral eyes millivolts which are dressed.  Psychiatric: Appears normal.  Neurologic: Alert awake oriented to time place and person. Does not move his right lower extremity from previous polio.  Labs on Admission:  Basic Metabolic Panel:  Recent Labs Lab 03/30/14 2349 04/01/14 1849  NA 137 140  K 3.3* 3.9  CL 94* 97  CO2 33* 34*  GLUCOSE 143* 126*  BUN 12 33*  CREATININE 1.93* 3.77*  CALCIUM 9.3 9.7   Liver Function Tests:  Recent Labs Lab 03/30/14 2349 04/01/14 1849  AST 24 23  ALT 11 11  ALKPHOS 137* 163*  BILITOT 0.7 0.9  PROT 7.1 7.3  ALBUMIN 2.9* 3.0*    Recent Labs Lab 03/30/14 2349  LIPASE 62*   No results for input(s): AMMONIA in the last 168 hours. CBC:  Recent Labs Lab 03/30/14 2349 04/01/14 1849  WBC 6.8 9.2  NEUTROABS 4.7  --   HGB 10.2* 10.5*  HCT 32.4* 33.4*  MCV 100.0 100.0  PLT 326 373   Cardiac Enzymes: No results for input(s): CKTOTAL, CKMB, CKMBINDEX, TROPONINI in the last 168  hours.  BNP (last 3 results) No results for input(s): PROBNP in the last 8760 hours. CBG: No results for input(s): GLUCAP in the last 168 hours.  Radiological Exams on Admission: No results found.   Assessment/Plan Principal Problem:   GI bleeding Active Problems:   MDS (myelodysplastic syndrome)   Chronic atrial fibrillation   ESRD on dialysis   Colostomy in place   GI bleed   Absolute anemia   1. GI bleed with history of AV malformation - since patient has some fine bleeding suspect could be lower GI. But given the history of previous cauterization and being on NSAIDs at this time I'll place patient on Protonix infusion. On-call gastroenterologist Dr. Hilarie Fredrickson was consulted by the ER physician and will be seeing patient in consult. Patient is at this time hemodynamically stable. Patient will be kept nothing by mouth except medications. Discontinue aspirin for now. 2. Chronic atrial fibrillation - heart rate is relatively controlled. Continue Coreg with holding parameters. Patient was not a candidate for anticoagulation secondary to AV malformations. At this time patient is on aspirin which is being discontinued. Patient and patient's daughter is agreeable to discontinuing aspirin with known risk factor of stroke. 3. Eucerin hemodialysis on Monday Wednesday and Friday - consult nephrology for dialysis. 4. Myelodysplastic syndrome on anagrelide 5. Chronic anemia with GI bleed - follow CBC closely. 6. Bilateral lower study wounds - wound team consult. 7. History of polio with right lower extremity weakness. Patient is largely bedbound over the last 1 year.  DVT Prophylaxis - SCDs.  Code Status:  Full code.  Family Communication:  Patient's daughter at the bedside.  Disposition Plan:  Admit to inpatient.    Kannen Moxey N. [facility] Pager [phone].  If 7PM-7AM, please contact night-coverage [email or web address] Password [email or web address] 04/02/2014, 12:00 AM

## 2014-04-02 NOTE — Consult Note (Signed)
Consultation  Referring Provider: [facility]     Primary Care Physician:  Bonnita Nasuti, MD Primary Gastroenterologist: Althia Forts     Reason for Consultation:  GI bleed            HPI:   Brent Mcneil is a 82 y.o. male with multiple medical problems including ESRD on HD transferred from an assisted living facility in [place] three days ago after staff found a large amount of blood in colostomy. Patient reports that colostomy was done for diverticular disease in [facility] five years ago. He has no abdominal pain, no nausea. He describes blood as being mixed with stool contents, mainly red but does endorse some black contents in stool.   Past Medical History  Diagnosis Date  . Chronic kidney disease     HD- M-W-F  . End stage renal disease   . Uremic encephalopathy     Metabolic  . Anemia   . Gastrointestinal bleed   . Ulcer     Peptic and gastric   . Ulcerative esophagitis   . Myeloproliferative disorder   . Macular degeneration 1990s    bilateral  . History of thrombocytosis   . Myocardial infarction 1996  . Polio age 66  . Pneumonia   . Stroke     approx 6-7 years ago, affected speech for a short time  . Atrial flutter   . Atrial fibrillation 08/2012    treated with Carvedilol, Echo done 09/22/12    Past Surgical History  Procedure Laterality Date  . Colon resection  09-2006  . Tonsillectomy  1958  . Orthopedic surgeries  as a child    due to polio  . Cataract surgery Right 2004  . Colon surgery      with Colostomy  . Av fistula placement Left 08/18/2012    Procedure: ARTERIOVENOUS (AV) FISTULA CREATION;  Surgeon: Serafina Mitchell, MD;  Location: [facility];  Service: Vascular;  Laterality: Left;  Marland Kitchen Eye surgery Right     cataract  . Cardiac catheterization  1996  . Av fistula placement Left 10/13/2012    Procedure: ARTERIOVENOUS (AV) FISTULA CREATION;  Surgeon: Serafina Mitchell, MD;  Location: [facility] OR;  Service: Vascular;  Laterality: Left;     Family History  Problem Relation Age of Onset  . Cancer Father   . Lymphoma Brother   . Lung cancer Sister   . Emphysema Sister      History  Substance Use Topics  . Smoking status: Former Smoker    Quit date: 09/28/1956  . Smokeless tobacco: Former Systems developer    Types: Chew  . Alcohol Use: No    Prior to Admission medications   Medication Sig Start Date End Date Taking? Authorizing Provider  acetaminophen (TYLENOL) 500 MG tablet Take 1,000 mg by mouth every 6 (six) hours as needed for fever (pain).   Yes Historical Provider, MD  anagrelide (AGRYLIN) 0.5 MG capsule Take 0.5 mg by mouth every other day.   Yes Historical Provider, MD  aspirin EC 81 MG tablet Take 81 mg by mouth every other day.   Yes Historical Provider, MD  beta carotene 25000 UNIT capsule Take 25,000 Units by mouth daily.    Yes Historical Provider, MD  carvedilol (COREG) 6.25 MG tablet Take 6.25 mg by mouth See admin instructions. Take 1 tablet (6.25 mg) twice daily, except: hold morning dose on Monday, Wednesday and Friday if SBP<110   Yes Historical Provider, MD  cinacalcet [facility])  30 MG tablet Take 30 mg by mouth daily with supper.    Yes Historical Provider, MD  finasteride (PROSCAR) 5 MG tablet Take 5 mg by mouth daily.    Yes Historical Provider, MD  indomethacin (INDOCIN) 25 MG capsule Take 25 mg by mouth every other day. bedtime   Yes Historical Provider, MD  lubiprostone (AMITIZA) 8 MCG capsule Take 8 mcg by mouth daily with breakfast.   Yes Historical Provider, MD  multivitamin (RENA-VIT) TABS tablet Take 1 tablet by mouth daily.    Yes Historical Provider, MD  Naloxegol Oxalate 25 MG TABS Take 25 mg by mouth daily.   Yes Historical Provider, MD  ondansetron (ZOFRAN) 4 MG tablet Take 4 mg by mouth every 4 (four) hours as needed for vomiting.   Yes Historical Provider, MD  pantoprazole (PROTONIX) 40 MG tablet Take 40 mg by mouth daily.    Yes Historical Provider, MD  polyvinyl alcohol (LIQUIFILM TEARS)  1.4 % ophthalmic solution Place 1 drop into both eyes every 2 (two) hours as needed for dry eyes.   Yes Historical Provider, MD  PRESCRIPTION MEDICATION Apply 1 application topically 2 (two) times daily as needed (rash). Nystatin mixed with triamcinolone 0.1% cream 1:1   Yes Historical Provider, MD  sevelamer carbonate (RENVELA) 800 MG tablet Take 1,600 mg by mouth 3 (three) times daily with meals.   Yes Historical Provider, MD  Tapentadol HCl 150 MG TB12 Take 150 mg by mouth every 12 (twelve) hours.   Yes Historical Provider, MD  traMADol (ULTRAM) 50 MG tablet Take 50 mg by mouth every 6 (six) hours as needed (pain).    Yes Historical Provider, MD  acetaminophen (TYLENOL) 325 MG tablet Take 1 tablet (325 mg total) by mouth every 6 (six) hours as needed for moderate pain (take with oxycodone until percocet arrives.  Do not take with percocet). Patient not taking: Reported on 04/01/2014 10/15/13   [name], MD  ciprofloxacin (CIPRO) 500 MG tablet Take 1 tablet (500 mg total) by mouth daily. Patient not taking: Reported on 04/01/2014 10/15/13   [name], MD  metroNIDAZOLE (FLAGYL) 500 MG tablet Take 1 tablet (500 mg total) by mouth 2 (two) times daily. Patient not taking: Reported on 04/01/2014 10/15/13   [name], MD  oxyCODONE-acetaminophen (PERCOCET/ROXICET) 5-325 MG per tablet Take 1 tablet by mouth every 4 (four) hours as needed for severe pain. Patient not taking: Reported on 04/01/2014 10/15/13   [name], MD    Current Facility-Administered Medications  Medication Dose Route Frequency Provider Last Rate Last Dose  . acetaminophen (TYLENOL) tablet 650 mg  650 mg Oral Q6H PRN Rise Patience, MD       Or  . acetaminophen (TYLENOL) suppository 650 mg  650 mg Rectal Q6H PRN Rise Patience, MD      . anagrelide (AGRYLIN) capsule 0.5 mg  0.5 mg Oral QODAY Rise Patience, MD      . carvedilol (COREG) tablet 6.25 mg  6.25 mg Oral BID Rise Patience, MD   6.25 mg  at 04/02/14 0215  . cinacalcet (SENSIPAR) tablet 30 mg  30 mg Oral Q supper Rise Patience, MD      . finasteride (PROSCAR) tablet 5 mg  5 mg Oral Daily Rise Patience, MD      . morphine 2 MG/ML injection 1 mg  1 mg Intravenous Q4H PRN Rise Patience, MD      . multivitamin (RENA-VIT) tablet 1 tablet  1  tablet Oral Daily Rise Patience, MD      . ondansetron [facility]) tablet 4 mg  4 mg Oral Q6H PRN Rise Patience, MD       Or  . ondansetron [facility]) injection 4 mg  4 mg Intravenous Q6H PRN Rise Patience, MD   4 mg at 04/02/14 0232  . ondansetron (ZOFRAN) tablet 4 mg  4 mg Oral Q4H PRN Rise Patience, MD      . pantoprazole (PROTONIX) 80 mg in sodium chloride 0.9 % 250 mL (0.32 mg/mL) infusion  8 mg/hr Intravenous Continuous Rise Patience, MD 25 mL/hr at 04/02/14 0215 8 mg/hr at 04/02/14 0215  . [START ON 04/05/2014] pantoprazole (PROTONIX) injection 40 mg  40 mg Intravenous Q12H Rise Patience, MD      . polyvinyl alcohol (LIQUIFILM TEARS) 1.4 % ophthalmic solution 1 drop  1 drop Both Eyes Q2H PRN Rise Patience, MD      . sevelamer carbonate (RENVELA) tablet 1,600 mg  1,600 mg Oral TID WC Rise Patience, MD      . sodium chloride 0.9 % injection 3 mL  3 mL Intravenous Q12H Rise Patience, MD   3 mL at 04/02/14 0218  . Tapentadol HCl TB12 150 mg  150 mg Oral Q12H Rise Patience, MD   150 mg at 04/02/14 0216  . traMADol (ULTRAM) tablet 50 mg  50 mg Oral Q6H PRN Rise Patience, MD        Allergies as of 04/01/2014  . (No Known Allergies)     Review of Systems:    Positive for mild weight loss / diminished appetite. All other systems reviewed and negative except where noted in HPI.    Physical Exam:  Vital signs in last 24 hours: Temp:  [97.6 F (36.4 C)-98.1 F (36.7 C)] 97.8 F (36.6 C) (01/18 0847) Pulse Rate:  [102-114] 114 (01/18 0847) Resp:  [17-18] 17 (01/18 0847) BP: (93-117)/(51-71) 93/52 mmHg (01/18  0847) SpO2:  [90 %-99 %] 90 % (01/18 0847) Weight:  [206 lb (93.441 kg)-207 lb 10.8 oz (94.2 kg)] 207 lb 10.8 oz (94.2 kg) (01/17 2215) Last BM Date: 04/01/14 General:   Pleasant white male in NAD Head:  Normocephalic and atraumatic. Eyes:   No icterus.   Conjunctiva pink. Ears:  Normal auditory acuity. Neck:  Supple; no masses felt Lungs:  Respirations even and unlabored. Lungs clear to auscultation bilaterally.   No wheezes, crackles, or rhonchi.  Heart:  Regular rate and rhythm;  murmur heard. Abdomen:  Soft, nondistended, nontender. Normal bowel sounds. No appreciable masses or hepatomegaly.  Rectal:  Not performed.  Msk:  Symmetrical without gross deformities.  Extremities:  Without edema. Neurologic:  Alert and  oriented x4;  grossly normal neurologically. Skin:  Intact without significant lesions or rashes. Cervical Nodes:  No significant cervical adenopathy. Psych:  Alert and cooperative. Normal affect.  LAB RESULTS:  Recent Labs  04/02/14 0104 04/02/14 0500 04/02/14 0751  WBC 10.1 10.3 9.3  HGB 9.9* 9.2* 9.7*  HCT 31.5* 28.9* 30.7*  PLT 359 352 358   BMET  Recent Labs  03/30/14 2349 04/01/14 1849 04/02/14 0500  NA 137 140 134*  K 3.3* 3.9 3.8  CL 94* 97 97  CO2 33* 34* 34*  GLUCOSE 143* 126* 98  BUN 12 33* 36*  CREATININE 1.93* 3.77* 3.96*  CALCIUM 9.3 9.7 9.3   LFT  Recent Labs  04/01/14 1849  PROT 7.3  ALBUMIN  3.0*  AST 23  ALT 11  ALKPHOS 163*  BILITOT 0.9   PT/INR  Recent Labs  03/30/14 2349 04/01/14 1958  LABPROT 17.2* 16.7*  INR 1.39 1.34    STUDIES: Ct Abdomen Pelvis W Contrast  04/02/2014   CLINICAL DATA:  Bleeding from colostomy bag for 2 days. History of myelodysplastic syndrome and end-stage renal disease.  EXAM: CT ABDOMEN AND PELVIS WITH CONTRAST  TECHNIQUE: Multidetector CT imaging of the abdomen and pelvis was performed using the standard protocol following bolus administration of intravenous contrast.  CONTRAST:  112mL  OMNIPAQUE IOHEXOL 300 MG/ML  SOLN  COMPARISON:  10/15/2013  FINDINGS: BODY WALL: Progressive gynecomastia on the right, partially visualized. No masslike component identified.  LOWER CHEST: Small pleural effusions, right greater than left, with atelectasis.  ABDOMEN/PELVIS:  Liver: No focal abnormality.  Biliary: Pericholecystic edema and/or gallbladder wall thickening that appears reactive. No gallbladder distention suggestive of obstruction.  Pancreas: Unremarkable.  Spleen: Splenomegaly which is chronic and stable. Maximal transverse dimension is 16 cm. Maximal axial thickness is 9 cm. There are 2 hypervascular masses within the upper spleen which measure up to 3 cm. These are stable from 2014 and thus benign.  Adrenals: Unremarkable.  Kidneys and ureters: Bilateral renal cortical thinning. There is an interpolar renal lesion on the left measuring 2.6 cm. Although mildly dense compared to water, there is no evidence of delayed enhancement or de enhancement and the lesion appeared cystic on ultrasound 05/23/2013. This is likely a proteinaceous or hemorrhagic cyst. There is a dense, possibly enhanced cortical lesion from the lower pole right kidney which is more suspicious. This was not seen on [facility] ultrasound 05/23/2013.  Bladder: Circumferential wall thickening. Further evaluation is limited due to decompressed state.  Reproductive: Moderate enlargement of the prostate gland.  Bowel: Descending colostomy which is intact and patent. There is diffuse colonic diverticulosis, with inflammation and wall thickening at the level of the sigmoid/ descending junction. No abscess or free perforation. There is no hyper enhancement of the bowel wall in this patient with history of AVM. Detection of intraluminal contrast extravasation is not possible due to oral contrast. Negative appendix.  Retroperitoneum: No mass or adenopathy.  Peritoneum: No ascites or pneumoperitoneum.  Vascular: No acute abnormality.   OSSEOUS: Marked atrophy of muscles around the right hip compatible with right lower extremity paresis.  L4 compression fracture with 75- 50% height loss. The fracture line is readily visible through the central body, exiting the posterior superior endplate. There is no subluxation or posterior element fracturing. No bony retropulsion.  IMPRESSION: 1. Colonic diverticulitis at the descending sigmoid junction. No abscess or free perforation. 2. Acute/unhealed L4 compression fracture. 3. 12 mm lesion in the right Kidney, neoplasm versus complicated cyst. Renal protocol CT with contrast would be the most definitive examination in this patient with end-stage renal disease, but given patient comorbidities and lack of growth since 2014, surveillance imaging would also be reasonable. 4. Multiple incidental findings noted above.   Electronically Signed   By: Jorje Guild M.D.   On: 04/02/2014 05:53   PREVIOUS ENDOSCOPIES:            No records. Patient reports colonoscopy in [facility] 5 or so years ago (time of colon resection).    Impression / Plan:   59. 81 year old male with multiple medical problems admitted with painless GI bleeding (through colostomy) on asa and Agrylin. Hb 9.7 ( 1-2 grams below baseline). No obvious blood in ostomy bag  today. This may have been diverticular bleeding. I cannot find in records but H+P mentions that patient has a history of intestinal AMVs which is also risk factor for bleeding.  He may need colonoscopy via colostomy, +/- EGD. Can have clears today. PPI drip can be changed to IV BID (no active bleeding).   2. Multiple medical problems as listed in PMH.    Thanks   LOS: 1 day   Tye Savoy  04/02/2014, 8:54 AM  GI ATTENDING  History, laboratories, x-rays reviewed. Patient personally seen and examined. Agree with H&P as outlined above. Elderly gentleman with multiple medical problems presents with painless bleeding per ostomy. Hemodynamically stable with mild  drop in hemoglobin. No further acute bleeding. Prior colostomy for what sounds like perforated diverticulitis. Currently imaging reveals diverticulosis. Suspect diverticular bleed. Would treat conservatively as such for now. Advance diet as tolerated with monitoring of ostomy contents and hemoglobin level. No plans for endoscopic evaluation unless bleeding persists or worsens. We'll follow. Thank you  Docia Chuck. Geri Seminole., M.D. [facility] Division of Gastroenterology

## 2014-04-02 NOTE — Consult Note (Signed)
Brent Mcneil Renal Consultation Note  Indication for Consultation:  Management of ESRD/hemodialysis; anemia, hypertension/volume and secondary hyperparathyroidism  HPI: Brent Mcneil is a 81 y.o. male admitted with GI Bleed, reportedly at his NH in [place] area blood seen in his colostomy bag and he was sent to the ER. He has a HO AVM's/gastric ulcer,colectomy from sever Diverticular Dz, severe DJD  R shoulder on indocin AND asa 81MG. He has Polio , wheel chair bound with some chronic foot wounds,Myeloproliferative DZ, (last HGB 11.4,  03-28-14 on Mircera 14m q 2weeks)HD MWF with last HD= past Friday on schedule. He reports some Vomiting after eating occasionally but denying  Nausea, abd pain, sob, chest pain, fevers, chills, fevers.    Past Medical History  Diagnosis Date  . Chronic kidney disease     HD- M-W-F  . End stage renal disease   . Uremic encephalopathy     Metabolic  . Anemia   . Gastrointestinal bleed   . Ulcer     Peptic and gastric   . Ulcerative esophagitis   . Myeloproliferative disorder   . Macular degeneration 1990s    bilateral  . History of thrombocytosis   . Myocardial infarction 1996  . Polio age 81 . Pneumonia   . Stroke     approx 6-7 years ago, affected speech for a short time  . Atrial flutter   . Atrial fibrillation 08/2012    treated with Carvedilol, Echo done 09/22/12    Past Surgical History  Procedure Laterality Date  . Colon resection  09-2006  . Tonsillectomy  1958  . Orthopedic surgeries  as a child    due to polio  . Cataract surgery Right 2004  . Colon surgery      with Colostomy  . Av fistula placement Left 08/18/2012    Procedure: ARTERIOVENOUS (AV) FISTULA CREATION;  Surgeon: VSerafina Mitchell MD;  Location: [facility]  Service: Vascular;  Laterality: Left;  .Marland KitchenEye surgery Right     cataract  . Cardiac catheterization  1996  . Av fistula placement Left 10/13/2012    Procedure: ARTERIOVENOUS (AV) FISTULA CREATION;   Surgeon: VSerafina Mitchell MD;  Location: [facility];  Service: Vascular;  Laterality: Left;      Family History  Problem Relation Age of Onset  . Cancer Father   . Lymphoma Brother   . Lung cancer Sister   . Emphysema Sister       reports that he quit smoking about 57 years ago. He has quit using smokeless tobacco. His smokeless tobacco use included Chew. He reports that he does not drink alcohol or use illicit drugs.  No Known Allergies  Prior to Admission medications   Medication Sig Start Date End Date Taking? Authorizing Provider  acetaminophen (TYLENOL) 500 MG tablet Take 1,000 mg by mouth every 6 (six) hours as needed for fever (pain).   Yes Historical Provider, MD  anagrelide (AGRYLIN) 0.5 MG capsule Take 0.5 mg by mouth every other day.   Yes Historical Provider, MD  aspirin EC 81 MG tablet Take 81 mg by mouth every other day.   Yes Historical Provider, MD  beta carotene 25000 UNIT capsule Take 25,000 Units by mouth daily.    Yes Historical Provider, MD  carvedilol (COREG) 6.25 MG tablet Take 6.25 mg by mouth See admin instructions. Take 1 tablet (6.25 mg) twice daily, except: hold morning dose on Monday, Wednesday and Friday if SBP<110   Yes Historical Provider, MD  cinacalcet (SENSIPAR) 30 MG tablet Take 30 mg by mouth daily with supper.    Yes Historical Provider, MD  finasteride (PROSCAR) 5 MG tablet Take 5 mg by mouth daily.    Yes Historical Provider, MD  indomethacin (INDOCIN) 25 MG capsule Take 25 mg by mouth every other day. bedtime   Yes Historical Provider, MD  lubiprostone (AMITIZA) 8 MCG capsule Take 8 mcg by mouth daily with breakfast.   Yes Historical Provider, MD  multivitamin (RENA-VIT) TABS tablet Take 1 tablet by mouth daily.    Yes Historical Provider, MD  Naloxegol Oxalate 25 MG TABS Take 25 mg by mouth daily.   Yes Historical Provider, MD  ondansetron (ZOFRAN) 4 MG tablet Take 4 mg by mouth every 4 (four) hours as needed for vomiting.   Yes Historical Provider, MD   pantoprazole (PROTONIX) 40 MG tablet Take 40 mg by mouth daily.    Yes Historical Provider, MD  polyvinyl alcohol (LIQUIFILM TEARS) 1.4 % ophthalmic solution Place 1 drop into both eyes every 2 (two) hours as needed for dry eyes.   Yes Historical Provider, MD  PRESCRIPTION MEDICATION Apply 1 application topically 2 (two) times daily as needed (rash). Nystatin mixed with triamcinolone 0.1% cream 1:1   Yes Historical Provider, MD  sevelamer carbonate (RENVELA) 800 MG tablet Take 1,600 mg by mouth 3 (three) times daily with meals.   Yes Historical Provider, MD  Tapentadol HCl 150 MG TB12 Take 150 mg by mouth every 12 (twelve) hours.   Yes Historical Provider, MD  traMADol (ULTRAM) 50 MG tablet Take 50 mg by mouth every 6 (six) hours as needed (pain).    Yes Historical Provider, MD  acetaminophen (TYLENOL) 325 MG tablet Take 1 tablet (325 mg total) by mouth every 6 (six) hours as needed for moderate pain (take with oxycodone until percocet arrives.  Do not take with percocet). Patient not taking: Reported on 04/01/2014 10/15/13   [name], MD  ciprofloxacin (CIPRO) 500 MG tablet Take 1 tablet (500 mg total) by mouth daily. Patient not taking: Reported on 04/01/2014 10/15/13   [name], MD  metroNIDAZOLE (FLAGYL) 500 MG tablet Take 1 tablet (500 mg total) by mouth 2 (two) times daily. Patient not taking: Reported on 04/01/2014 10/15/13   [name], MD  oxyCODONE-acetaminophen (PERCOCET/ROXICET) 5-325 MG per tablet Take 1 tablet by mouth every 4 (four) hours as needed for severe pain. Patient not taking: Reported on 04/01/2014 10/15/13   [name], MD    Continuous:   Results for orders placed or performed during the hospital encounter of 04/01/14 (from the past 48 hour(s))  CBC     Status: Abnormal   Collection Time: 04/01/14  6:49 PM  Result Value Ref Range   WBC 9.2 4.0 - 10.5 K/uL   RBC 3.34 (L) 4.22 - 5.81 MIL/uL   Hemoglobin 10.5 (L) 13.0 - 17.0 g/dL   HCT 33.4 (L) 39.0 - 52.0  %   MCV 100.0 78.0 - 100.0 fL   MCH 31.4 26.0 - 34.0 pg   MCHC 31.4 30.0 - 36.0 g/dL   RDW 16.4 (H) 11.5 - 15.5 %   Platelets 373 150 - 400 K/uL  Comprehensive metabolic panel     Status: Abnormal   Collection Time: 04/01/14  6:49 PM  Result Value Ref Range   Sodium 140 135 - 145 mmol/L    Comment: Please note change in reference range.   Potassium 3.9 3.5 - 5.1 mmol/L    Comment: Please  note change in reference range.   Chloride 97 96 - 112 mEq/L   CO2 34 (H) 19 - 32 mmol/L   Glucose, Bld 126 (H) 70 - 99 mg/dL   BUN 33 (H) 6 - 23 mg/dL   Creatinine, Ser 3.77 (H) 0.50 - 1.35 mg/dL   Calcium 9.7 8.4 - 10.5 mg/dL   Total Protein 7.3 6.0 - 8.3 g/dL   Albumin 3.0 (L) 3.5 - 5.2 g/dL   AST 23 0 - 37 U/L   ALT 11 0 - 53 U/L   Alkaline Phosphatase 163 (H) 39 - 117 U/L   Total Bilirubin 0.9 0.3 - 1.2 mg/dL   GFR calc non Af Amer 13 (L) >90 mL/min   GFR calc Af Amer 15 (L) >90 mL/min    Comment: (NOTE) The eGFR has been calculated using the CKD EPI equation. This calculation has not been validated in all clinical situations. eGFR's persistently <90 mL/min signify possible Chronic Kidney Disease.    Anion gap 9 5 - 15  Type and screen     Status: None   Collection Time: 04/01/14  7:58 PM  Result Value Ref Range   ABO/RH(D) A POS    Antibody Screen NEG    Sample Expiration 04/04/2014   Protime-INR     Status: Abnormal   Collection Time: 04/01/14  7:58 PM  Result Value Ref Range   Prothrombin Time 16.7 (H) 11.6 - 15.2 seconds   INR 1.34 0.00 - 1.49  Glucose, capillary     Status: Abnormal   Collection Time: 04/02/14 12:14 AM  Result Value Ref Range   Glucose-Capillary 113 (H) 70 - 99 mg/dL  CBC     Status: Abnormal   Collection Time: 04/02/14  1:04 AM  Result Value Ref Range   WBC 10.1 4.0 - 10.5 K/uL   RBC 3.10 (L) 4.22 - 5.81 MIL/uL   Hemoglobin 9.9 (L) 13.0 - 17.0 g/dL   HCT 31.5 (L) 39.0 - 52.0 %   MCV 101.6 (H) 78.0 - 100.0 fL   MCH 31.9 26.0 - 34.0 pg   MCHC 31.4  30.0 - 36.0 g/dL   RDW 16.5 (H) 11.5 - 15.5 %   Platelets 359 150 - 400 K/uL  Lactic acid, plasma     Status: None   Collection Time: 04/02/14  1:04 AM  Result Value Ref Range   Lactic Acid, Venous 1.0 0.5 - 2.2 mmol/L  Glucose, capillary     Status: Abnormal   Collection Time: 04/02/14  3:53 AM  Result Value Ref Range   Glucose-Capillary 103 (H) 70 - 99 mg/dL  CBC     Status: Abnormal   Collection Time: 04/02/14  5:00 AM  Result Value Ref Range   WBC 10.3 4.0 - 10.5 K/uL   RBC 2.91 (L) 4.22 - 5.81 MIL/uL   Hemoglobin 9.2 (L) 13.0 - 17.0 g/dL   HCT 28.9 (L) 39.0 - 52.0 %   MCV 99.3 78.0 - 100.0 fL   MCH 31.6 26.0 - 34.0 pg   MCHC 31.8 30.0 - 36.0 g/dL   RDW 16.7 (H) 11.5 - 15.5 %   Platelets 352 150 - 400 K/uL  Basic metabolic panel     Status: Abnormal   Collection Time: 04/02/14  5:00 AM  Result Value Ref Range   Sodium 134 (L) 135 - 145 mmol/L    Comment: Please note change in reference range.   Potassium 3.8 3.5 - 5.1 mmol/L    Comment: Please  note change in reference range.   Chloride 97 96 - 112 mEq/L   CO2 34 (H) 19 - 32 mmol/L   Glucose, Bld 98 70 - 99 mg/dL   BUN 36 (H) 6 - 23 mg/dL   Creatinine, Ser 3.96 (H) 0.50 - 1.35 mg/dL   Calcium 9.3 8.4 - 10.5 mg/dL   GFR calc non Af Amer 12 (L) >90 mL/min   GFR calc Af Amer 14 (L) >90 mL/min    Comment: (NOTE) The eGFR has been calculated using the CKD EPI equation. This calculation has not been validated in all clinical situations. eGFR's persistently <90 mL/min signify possible Chronic Kidney Disease.    Anion gap 3 (L) 5 - 15  Glucose, capillary     Status: None   Collection Time: 04/02/14  7:39 AM  Result Value Ref Range   Glucose-Capillary 86 70 - 99 mg/dL  CBC     Status: Abnormal   Collection Time: 04/02/14  7:51 AM  Result Value Ref Range   WBC 9.3 4.0 - 10.5 K/uL   RBC 3.09 (L) 4.22 - 5.81 MIL/uL   Hemoglobin 9.7 (L) 13.0 - 17.0 g/dL   HCT 30.7 (L) 39.0 - 52.0 %   MCV 99.4 78.0 - 100.0 fL   MCH 31.4  26.0 - 34.0 pg   MCHC 31.6 30.0 - 36.0 g/dL   RDW 16.7 (H) 11.5 - 15.5 %   Platelets 358 150 - 400 K/uL  Glucose, capillary     Status: None   Collection Time: 04/02/14 11:13 AM  Result Value Ref Range   Glucose-Capillary 86 70 - 99 mg/dL     ROS: see hpi for pos.  Physical Exam: Filed Vitals:   04/02/14 0847  BP: 93/52  Pulse: 114  Temp: 97.8 F (36.6 C)  Resp: 17     General: Alert Elderly WM, OX3 HEENT: [place], MM dry. Neck: no jvd Heart: RRR, no mur, rub, gallop Lungs: CTA bilat, nonlabored Abdomen: Obese, bs pos. , soft, Non tender , ND, Colostomy with brown stool in bag , no blood seen Extremities: L 3+ lower extrem edema/ R 2+ lower extrem edema Skin: No overt rash, R Lateral Ankle ulcer Dressing not removed/ R Lat ankle ulcer Dry  Neuro: OX3, , alert, bilat lower extrem paresis, moves bilat  Upper extrem  Dialysis Access: L UA AVF pos  Bruit  Dialysis Orders: Center: [facility]   on MWF . EDW 94.5 kg  HD Bath 2.0 k, 2.0 ca  Time 4.5 hrs Heparin 4000 u start and 2000 mid run. Access L UA AAVF      Vit D 0 mcg IV/HD  Mircera 76mg q 2weeks ( Given  03/28/14)   Units IV/HD  Venofer  521mq weekly hd  Other hgb 11.4 1/13/ 16,   Phos 5.9,  Ca 10.1 02-28-14  pth 268  01/10/14  Assessment/Plan 1. GI Bld  With HO AVMs/ Divertic DZ with colostomy Bag= admit team consulting GI , Hgb drop 11.4 ( 5 days ago) to 9.7 on admit / no hep hd 2. ESRD -  HD today on schedule MWF ([facility] no hep HD 3. Hypertension/volume  - bp 93/52/ wt at edw with bilat pedal edema (low dose Coreg 6.2568mid) on for A fib 4. Anemia  - Sec to ?GI Bleed and CKD- aranesp in hoosp with weekly fe on hd, fu hgbs 5. Metabolic bone disease -  Renvela binder and sensipar when eating  No vit D currently  6. HO A . Fib- SR on exam/ Not on coumadin with ho avm bleeds in past 7. Bilat lower leg woounds - skin care team seeing / Appear stable  8. HO Polio - bed bound 9. Myelodysplastic syndrome - on Anagrelide  Ernest Haber, PA-C [facility] [phone] 04/02/2014, 12:02 PM      I have seen and examined this patient and agree with plan as outlined by D. Zeyfang, PA-C.  Overall Mr. Carrick is comfortable and able to verbalize the reason for his admission.  No abdominal pain and he reports blood (dark and red) from his colostomy.  No issues with outpt dialysis.  Will continue to provide HD while he remains an inpatient and hold heparin/follow H/H. [place] A,MD 04/02/2014 1:23 PM

## 2014-04-03 DIAGNOSIS — N186 End stage renal disease: Secondary | ICD-10-CM

## 2014-04-03 DIAGNOSIS — D469 Myelodysplastic syndrome, unspecified: Secondary | ICD-10-CM

## 2014-04-03 DIAGNOSIS — K5791 Diverticulosis of intestine, part unspecified, without perforation or abscess with bleeding: Secondary | ICD-10-CM

## 2014-04-03 DIAGNOSIS — D649 Anemia, unspecified: Secondary | ICD-10-CM

## 2014-04-03 DIAGNOSIS — I482 Chronic atrial fibrillation: Secondary | ICD-10-CM

## 2014-04-03 DIAGNOSIS — Z992 Dependence on renal dialysis: Secondary | ICD-10-CM

## 2014-04-03 LAB — RENAL FUNCTION PANEL
ANION GAP: 13 (ref 5–15)
Albumin: 2.8 g/dL — ABNORMAL LOW (ref 3.5–5.2)
BUN: 42 mg/dL — AB (ref 6–23)
CO2: 28 mmol/L (ref 19–32)
Calcium: 9.1 mg/dL (ref 8.4–10.5)
Chloride: 94 mEq/L — ABNORMAL LOW (ref 96–112)
Creatinine, Ser: 4.87 mg/dL — ABNORMAL HIGH (ref 0.50–1.35)
GFR calc non Af Amer: 10 mL/min — ABNORMAL LOW (ref >90)
GFR, EST AFRICAN AMERICAN: 11 mL/min — AB (ref >90)
Glucose, Bld: 80 mg/dL (ref 70–99)
POTASSIUM: 4.5 mmol/L (ref 3.5–5.1)
Phosphorus: 5.5 mg/dL — ABNORMAL HIGH (ref 2.3–4.6)
SODIUM: 135 mmol/L (ref 135–145)

## 2014-04-03 LAB — GLUCOSE, CAPILLARY
GLUCOSE-CAPILLARY: 87 mg/dL (ref 70–99)
Glucose-Capillary: 75 mg/dL (ref 70–99)
Glucose-Capillary: 79 mg/dL (ref 70–99)

## 2014-04-03 LAB — CBC
HEMATOCRIT: 29.6 % — AB (ref 39.0–52.0)
Hemoglobin: 9.5 g/dL — ABNORMAL LOW (ref 13.0–17.0)
MCH: 31.7 pg (ref 26.0–34.0)
MCHC: 32.1 g/dL (ref 30.0–36.0)
MCV: 98.7 fL (ref 78.0–100.0)
Platelets: 350 10*3/uL (ref 150–400)
RBC: 3 MIL/uL — ABNORMAL LOW (ref 4.22–5.81)
RDW: 16.9 % — ABNORMAL HIGH (ref 11.5–15.5)
WBC: 9.5 10*3/uL (ref 4.0–10.5)

## 2014-04-03 MED ORDER — PANTOPRAZOLE SODIUM 40 MG PO TBEC
40.0000 mg | DELAYED_RELEASE_TABLET | Freq: Every day | ORAL | Status: DC
  Administered 2014-04-03: 40 mg via ORAL

## 2014-04-03 NOTE — Procedures (Signed)
Patient was seen on dialysis and the procedure was supervised. BFR 400 Via LAVF BP is 113/57.  Patient appears to be tolerating treatment well and GI to follow without intervention as there is high likelihood of diverticular bleed and recommend conservative therapy.

## 2014-04-03 NOTE — Discharge Summary (Signed)
Physician Discharge Summary  Brent Mcneil PJA:[id number] DOB: 06-29-1923 DOA: 04/01/2014  PCP: Bonnita Nasuti, MD  Admit date: 04/01/2014 Discharge date: 04/03/2014  Time spent: 45 minutes  Recommendations for Outpatient Follow-up:  Patient will be discharged home/retirement community.  He is to continue dialysis as scheduled. Patient should followup with is primary care physician within one week of discharge.  He should followup with his gastroenterologist as needed.  Patient should resume a renal diet.  Continue taking medications as prescribed.   Discharge Diagnoses:  Principal Problem:   GI bleeding Active Problems:   MDS (myelodysplastic syndrome)   Chronic atrial fibrillation   ESRD on dialysis   Colostomy in place   GI bleed   Absolute anemia   Bleeding gastrointestinal   Discharge Condition: Stable  Diet recommendation: Renal diet  Filed Weights   04/01/14 2215 04/03/14 0554 04/03/14 1047  Weight: 94.2 kg (207 lb 10.8 oz) 94.9 kg (209 lb 3.5 oz) 91.2 kg (201 lb 1 oz)    History of present illness:  on 04/02/2014 by Dr. Gean Birchwood MATHHEW BUYSSE is a 81 y.o. 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 Patient also initially noticed some left lower quadrant pain which has resolved at this time. Denies any fever chills or any recent use of antibiotics. Denies any nausea vomiting. Patient's hemoglobin has been at the baseline and has been hemodynamically stable though patient did feel some dizziness. Denies any chest pain or shortness of breath. Patient is on aspirin for his atrial fibrillation which patient takes it every other day. Patient also on NSAIDs for gout. Patient states the bleeding is more  frank blood rather than melena. It is not continuous but intermittent mixed with normal stools.   Hospital Course:  GI bleed with history of AVM -Gastroenterology consulted and appreciated and felt this to be possible diverticular bleed and to treat conservatively, no endoscopic eval unless bleeding worsens or persists. -Initially placed on protonix drip and swtiched to protonix 40mg  IV BID by GI- but may resume home dose -Hemoglobin currently remain stable -no active signs of bleeding in colostomy  End-stage renal disease -Nephrology consult and appreciated -Patient dialyzes Monday, Wednesday, Friday  Chronic atrial fibrillation -Rate controlled -Patient is not anticoagulant candidate due to history of AVMs -Continue Coreg with holding parameters -Aspirin held  Myelodysplastic syndrome -Continue Anagrelide  Chronic anemia -Hemoglobin currently stable  -continue to monitor CBC  Bilateral lower extremity wounds -Wound care consulted and appreciated  History of polio with right lower extremity weakness -Patient become more bedbound the last year -orthopedic tech- unna boot  Procedures: None  Consultations: Nephrology Gastroenterology  Discharge Exam: Filed Vitals:   04/03/14 1153  BP: 119/46  Pulse: 98  Temp: 97.8 F (36.6 C)  Resp: 18   Exam  General: Well developed, well nourished, NAD, appears stated age  HEENT: NCAT, mucous membranes moist.   Cardiovascular: S1 S2 auscultated, irregular  Respiratory: Clear to auscultation bilaterally with equal chest rise  Abdomen: Soft, nontender, nondistended, + bowel sounds, +colostomy  Extremities: warm dry without cyanosis clubbing or edema  Neuro: AAOx3, nonfocal- does not move RLE due to polio  Skin: Multiple wounds on LE-dressed  Psych: Normal affect and demeanor with intact judgement and insight  Discharge Instructions  Discharge Instructions    Discharge instructions    Complete by:  As  directed   Patient will be discharged home/retirement community.  He is to continue dialysis as scheduled. Patient should followup with is primary care physician within one week of discharge.  He should followup with his gastroenterologist as needed.  Patient should resume a renal diet.  Continue taking medications as prescribed.            Medication List    STOP taking these medications        ciprofloxacin 500 MG tablet  Commonly known as:  CIPRO     indomethacin 25 MG capsule  Commonly known as:  INDOCIN     metroNIDAZOLE 500 MG tablet  Commonly known as:  FLAGYL     oxyCODONE-acetaminophen 5-325 MG per tablet  Commonly known as:  PERCOCET/ROXICET      TAKE these medications        acetaminophen 500 MG tablet  Commonly known as:  TYLENOL  Take 1,000 mg by mouth every 6 (six) hours as needed for fever (pain).     acetaminophen 325 MG tablet  Commonly known as:  TYLENOL  Take 1 tablet (325 mg total) by mouth every 6 (six) hours as needed for moderate pain (take with oxycodone until percocet arrives.  Do not take with percocet).     anagrelide 0.5 MG capsule  Commonly known as:  AGRYLIN  Take 0.5 mg by mouth every other day.     aspirin EC 81 MG tablet  Take 81 mg by mouth every other day.     beta carotene 25000 UNIT capsule  Take 25,000 Units by mouth daily.     carvedilol 6.25 MG tablet  Commonly known as:  COREG  Take 6.25 mg by mouth See admin instructions. Take 1 tablet (6.25 mg) twice daily, except: hold morning dose on Monday, Wednesday and Friday if SBP<110     cinacalcet 30 MG tablet  Commonly known as:  SENSIPAR  Take 30 mg by mouth daily with supper.     finasteride 5 MG tablet  Commonly known as:  PROSCAR  Take 5 mg by mouth daily.     lubiprostone 8 MCG capsule  Commonly known as:  AMITIZA  Take 8 mcg by mouth daily with breakfast.     multivitamin Tabs tablet  Take 1 tablet by mouth daily.     Naloxegol Oxalate 25 MG Tabs  Take 25 mg by  mouth daily.     ondansetron 4 MG tablet  Commonly known as:  ZOFRAN  Take 4 mg by mouth every 4 (four) hours as needed for vomiting.     pantoprazole 40 MG tablet  Commonly known as:  PROTONIX  Take 40 mg by mouth daily.     polyvinyl alcohol 1.4 % ophthalmic solution  Commonly known as:  LIQUIFILM TEARS  Place 1 drop into both eyes every 2 (two) hours as needed for dry eyes.     PRESCRIPTION MEDICATION  Apply 1 application topically 2 (two) times daily as needed (rash). Nystatin mixed with triamcinolone 0.1% cream 1:1     sevelamer carbonate 800 MG tablet  Commonly known as:  RENVELA  Take 1,600 mg by mouth 3 (three) times daily with meals.     Tapentadol HCl 150 MG Tb12  Take 150 mg by mouth every 12 (twelve) hours.     traMADol 50 MG tablet  Commonly known as:  ULTRAM  Take 50 mg by mouth  every 6 (six) hours as needed (pain).       No Known Allergies Follow-up Information    Follow up with HAGUE, Rosalyn Charters, MD. Schedule an appointment as soon as possible for a visit in 1 week.   Specialty:  Internal Medicine   Why:  Hospital followup   Contact information:   [address] [place] [address] [phone]        The results of significant diagnostics from this hospitalization (including imaging, microbiology, ancillary and laboratory) are listed below for reference.    Significant Diagnostic Studies: Ct Abdomen Pelvis W Contrast  04/02/2014   CLINICAL DATA:  Bleeding from colostomy bag for 2 days. History of myelodysplastic syndrome and end-stage renal disease.  EXAM: CT ABDOMEN AND PELVIS WITH CONTRAST  TECHNIQUE: Multidetector CT imaging of the abdomen and pelvis was performed using the standard protocol following bolus administration of intravenous contrast.  CONTRAST:  143mL OMNIPAQUE IOHEXOL 300 MG/ML  SOLN  COMPARISON:  10/15/2013  FINDINGS: BODY WALL: Progressive gynecomastia on the right, partially visualized. No masslike component identified.  LOWER  CHEST: Small pleural effusions, right greater than left, with atelectasis.  ABDOMEN/PELVIS:  Liver: No focal abnormality.  Biliary: Pericholecystic edema and/or gallbladder wall thickening that appears reactive. No gallbladder distention suggestive of obstruction.  Pancreas: Unremarkable.  Spleen: Splenomegaly which is chronic and stable. Maximal transverse dimension is 16 cm. Maximal axial thickness is 9 cm. There are 2 hypervascular masses within the upper spleen which measure up to 3 cm. These are stable from 2014 and thus benign.  Adrenals: Unremarkable.  Kidneys and ureters: Bilateral renal cortical thinning. There is an interpolar renal lesion on the left measuring 2.6 cm. Although mildly dense compared to water, there is no evidence of delayed enhancement or de enhancement and the lesion appeared cystic on ultrasound 05/23/2013. This is likely a proteinaceous or hemorrhagic cyst. There is a dense, possibly enhanced cortical lesion from the lower pole right kidney which is more suspicious. This was not seen on [facility] ultrasound 05/23/2013.  Bladder: Circumferential wall thickening. Further evaluation is limited due to decompressed state.  Reproductive: Moderate enlargement of the prostate gland.  Bowel: Descending colostomy which is intact and patent. There is diffuse colonic diverticulosis, with inflammation and wall thickening at the level of the sigmoid/ descending junction. No abscess or free perforation. There is no hyper enhancement of the bowel wall in this patient with history of AVM. Detection of intraluminal contrast extravasation is not possible due to oral contrast. Negative appendix.  Retroperitoneum: No mass or adenopathy.  Peritoneum: No ascites or pneumoperitoneum.  Vascular: No acute abnormality.  OSSEOUS: Marked atrophy of muscles around the right hip compatible with right lower extremity paresis.  L4 compression fracture with 75- 50% height loss. The fracture line is readily visible  through the central body, exiting the posterior superior endplate. There is no subluxation or posterior element fracturing. No bony retropulsion.  IMPRESSION: 1. Colonic diverticulitis at the descending sigmoid junction. No abscess or free perforation. 2. Acute/unhealed L4 compression fracture. 3. 12 mm lesion in the right Kidney, neoplasm versus complicated cyst. Renal protocol CT with contrast would be the most definitive examination in this patient with end-stage renal disease, but given patient comorbidities and lack of growth since 2014, surveillance imaging would also be reasonable. 4. Multiple incidental findings noted above.   Electronically Signed   By: Jorje Guild M.D.   On: 04/02/2014 05:53    Microbiology: No results found for this or  any previous visit (from the past 240 hour(s)).   Labs: Basic Metabolic Panel:  Recent Labs Lab 03/30/14 2349 04/01/14 1849 04/02/14 0500 04/03/14 0648  NA 137 140 134* 135  K 3.3* 3.9 3.8 4.5  CL 94* 97 97 94*  CO2 33* 34* 34* 28  GLUCOSE 143* 126* 98 80  BUN 12 33* 36* 42*  CREATININE 1.93* 3.77* 3.96* 4.87*  CALCIUM 9.3 9.7 9.3 9.1  PHOS  --   --   --  5.5*   Liver Function Tests:  Recent Labs Lab 03/30/14 2349 04/01/14 1849 04/03/14 0648  AST 24 23  --   ALT 11 11  --   ALKPHOS 137* 163*  --   BILITOT 0.7 0.9  --   PROT 7.1 7.3  --   ALBUMIN 2.9* 3.0* 2.8*    Recent Labs Lab 03/30/14 2349  LIPASE 62*   No results for input(s): AMMONIA in the last 168 hours. CBC:  Recent Labs Lab 03/30/14 2349  04/02/14 0104 04/02/14 0500 04/02/14 0751 04/02/14 1145 04/03/14 0647  WBC 6.8  < > 10.1 10.3 9.3 10.3 9.5  NEUTROABS 4.7  --   --   --   --   --   --   HGB 10.2*  < > 9.9* 9.2* 9.7* 9.6* 9.5*  HCT 32.4*  < > 31.5* 28.9* 30.7* 29.9* 29.6*  MCV 100.0  < > 101.6* 99.3 99.4 99.7 98.7  PLT 326  < > 359 352 358 389 350  < > = values in this interval not displayed. Cardiac Enzymes: No results for input(s): CKTOTAL,  CKMB, CKMBINDEX, TROPONINI in the last 168 hours. BNP: BNP (last 3 results) No results for input(s): PROBNP in the last 8760 hours. CBG:  Recent Labs Lab 04/02/14 1113 04/02/14 1624 04/03/14 0012 04/03/14 0425 04/03/14 1151  GLUCAP 86 86 79 75 87       Signed:  Imer Foxworth  [facility] 04/03/2014, 12:16 PM

## 2014-04-03 NOTE — Progress Notes (Signed)
Daily Rounding Note  04/03/2014, 10:56 AM  LOS: 2 days   SUBJECTIVE:       No abdominal pain.  No blood per ostomy.  No fevers, no n/v Still on clears.  Dtr says pt follows non renal, "whatever" diet because otherwise eats very little.  OBJECTIVE:         Vital signs in last 24 hours:    Temp:  [97.8 F (36.6 C)-98.5 F (36.9 C)] 97.8 F (36.6 C) (01/19 1047) Pulse Rate:  [104-119] 106 (01/19 1030) Resp:  [17-18] 17 (01/19 0554) BP: (103-120)/(53-65) 114/61 mmHg (01/19 1030) SpO2:  [92 %-96 %] 96 % (01/19 1047) Weight:  [209 lb 3.5 oz (94.9 kg)] 209 lb 3.5 oz (94.9 kg) (01/19 0554) Last BM Date: 04/03/14 (colostomy) Filed Weights   04/01/14 1837 04/01/14 2215 04/03/14 0554  Weight: 206 lb (93.441 kg) 207 lb 10.8 oz (94.2 kg) 209 lb 3.5 oz (94.9 kg)   General: looks frail, aged and chronically ill.     Heart: RRR Chest: diminished BS, no cough or dyspnea.  Abdomen: soft, liquid brown stool in ostomy.  Minor left LQ tenderness Extremities: Bil LE edema and coband wrap on both legs/feet. Neuro/Psych:  Appropriate, laconic, slow responses but oriented x 3.   Intake/Output from previous day: 01/18 0701 - 01/19 0700 In: 320 [P.O.:320] Out: 475 [Stool:475]  Intake/Output this shift: Total I/O In: -  Out: 5956 [Other:3375]  Lab Results:  Recent Labs  04/02/14 0751 04/02/14 1145 04/03/14 0647  WBC 9.3 10.3 9.5  HGB 9.7* 9.6* 9.5*  HCT 30.7* 29.9* 29.6*  PLT 358 389 350   BMET  Recent Labs  04/01/14 1849 04/02/14 0500 04/03/14 0648  NA 140 134* 135  K 3.9 3.8 4.5  CL 97 97 94*  CO2 34* 34* 28  GLUCOSE 126* 98 80  BUN 33* 36* 42*  CREATININE 3.77* 3.96* 4.87*  CALCIUM 9.7 9.3 9.1   LFT  Recent Labs  04/01/14 1849 04/03/14 0648  PROT 7.3  --   ALBUMIN 3.0* 2.8*  AST 23  --   ALT 11  --   ALKPHOS 163*  --   BILITOT 0.9  --    PT/INR  Recent Labs  04/01/14 1958  LABPROT 16.7*  INR  1.34   Hepatitis Panel No results for input(s): HEPBSAG, HCVAB, HEPAIGM, HEPBIGM in the last 72 hours.  Studies/Results: Ct Abdomen Pelvis W Contrast 04/02/2014   CLINICAL DATA:  Bleeding from colostomy bag for 2 days. History of myelodysplastic syndrome and end-stage renal disease.  EXAM: CT ABDOMEN AND PELVIS WITH CONTRAST  TECHNIQUE: Multidetector CT imaging of the abdomen and pelvis was performed using the standard protocol following bolus administration of intravenous contrast.  CONTRAST:  169mL OMNIPAQUE IOHEXOL 300 MG/ML  SOLN  COMPARISON:  10/15/2013  FINDINGS: BODY WALL: Progressive gynecomastia on the right, partially visualized. No masslike component identified.  LOWER CHEST: Small pleural effusions, right greater than left, with atelectasis.  ABDOMEN/PELVIS:  Liver: No focal abnormality.  Biliary: Pericholecystic edema and/or gallbladder wall thickening that appears reactive. No gallbladder distention suggestive of obstruction.  Pancreas: Unremarkable.  Spleen: Splenomegaly which is chronic and stable. Maximal transverse dimension is 16 cm. Maximal axial thickness is 9 cm. There are 2 hypervascular masses within the upper spleen which measure up to 3 cm. These are stable from 2014 and thus benign.  Adrenals: Unremarkable.  Kidneys and ureters: Bilateral renal cortical thinning. There is an  interpolar renal lesion on the left measuring 2.6 cm. Although mildly dense compared to water, there is no evidence of delayed enhancement or de enhancement and the lesion appeared cystic on ultrasound 05/23/2013. This is likely a proteinaceous or hemorrhagic cyst. There is a dense, possibly enhanced cortical lesion from the lower pole right kidney which is more suspicious. This was not seen on [facility] ultrasound 05/23/2013.  Bladder: Circumferential wall thickening. Further evaluation is limited due to decompressed state.  Reproductive: Moderate enlargement of the prostate gland.  Bowel: Descending  colostomy which is intact and patent. There is diffuse colonic diverticulosis, with inflammation and wall thickening at the level of the sigmoid/ descending junction. No abscess or free perforation. There is no hyper enhancement of the bowel wall in this patient with history of AVM. Detection of intraluminal contrast extravasation is not possible due to oral contrast. Negative appendix.  Retroperitoneum: No mass or adenopathy.  Peritoneum: No ascites or pneumoperitoneum.  Vascular: No acute abnormality.  OSSEOUS: Marked atrophy of muscles around the right hip compatible with right lower extremity paresis.  L4 compression fracture with 75- 50% height loss. The fracture line is readily visible through the central body, exiting the posterior superior endplate. There is no subluxation or posterior element fracturing. No bony retropulsion.  IMPRESSION: 1. Colonic diverticulitis at the descending sigmoid junction. No abscess or free perforation. 2. Acute/unhealed L4 compression fracture. 3. 12 mm lesion in the right Kidney, neoplasm versus complicated cyst. Renal protocol CT with contrast would be the most definitive examination in this patient with end-stage renal disease, but given patient comorbidities and lack of growth since 2014, surveillance imaging would also be reasonable. 4. Multiple incidental findings noted above.   Electronically Signed   By: Jorje Guild M.D.   On: 04/02/2014 05:53   Scheduled Meds: . anagrelide  0.5 mg Oral QODAY  . carvedilol  6.25 mg Oral BID  . cinacalcet  30 mg Oral Q supper  . [START ON 04/04/2014] darbepoetin (ARANESP) injection - DIALYSIS  60 mcg Intravenous Q Wed-HD  . [START ON 04/04/2014] ferric gluconate (FERRLECIT/NULECIT) IV  62.5 mg Intravenous Q Wed-HD  . finasteride  5 mg Oral Daily  . multivitamin  1 tablet Oral Daily  . pantoprazole (PROTONIX) IV  40 mg Intravenous Q12H  . sevelamer carbonate  1,600 mg Oral TID WC  . sodium chloride  3 mL Intravenous Q12H  .  Tapentadol HCl  150 mg Oral Q12H   Continuous Infusions:  PRN Meds:.acetaminophen **OR** acetaminophen, morphine injection, ondansetron **OR** ondansetron (ZOFRAN) IV, ondansetron, polyvinyl alcohol, traMADol  ASSESMENT:   *  Painless bleeding into ostomy has resolved.  Previous colostomy/colon resection 2008 for diverticular disease. On nightly Indocin pta.  On Movantik PTA for opioid induced constipation.  CT scan suggest sigmoid diverticulitis but normal WBCs and no fever, no abdominal pain.   No need for abx.     *  ESRD.    *  Thrombocytosis hx, stable splenomegaly.On Anagrelide platelet therapy  *  Acute on chronic anemia.  Has not required transfusion. MCV elevated at admit. On weekly Ferric gluconate and Aranesp.    PLAN   *  Stop IV Protonix. Resume home dose of oral PPI.  Resume home regular diet. OK to discharge back to SNF   Azucena Freed  04/03/2014, 10:56 AM Pager: [phone]  GI ATTENDING  Interval history data reviewed. No further bleeding. Agree with both progress note. Benign abdomen. Okay to return to skilled nursing facility.  Docia Chuck. Geri Seminole., M.D. [facility] Division of Gastroenterology

## 2014-04-03 NOTE — Discharge Instructions (Signed)

## 2014-04-03 NOTE — Progress Notes (Signed)
Discharge instructions reviewed with patient/family. All questions answered at this time. No other distress noted. Transport by daughter back to facility. Report given to Athena Masse at [facility] in [place].  Ave Filter, RN

## 2014-04-04 LAB — HEPATITIS B SURFACE ANTIBODY,QUALITATIVE: Hep B S Ab: NONREACTIVE — AB

## 2014-10-15 DIAGNOSIS — S72309A Unspecified fracture of shaft of unspecified femur, initial encounter for closed fracture: Secondary | ICD-10-CM

## 2014-10-15 HISTORY — DX: Unspecified fracture of shaft of unspecified femur, initial encounter for closed fracture: S72.309A

## 2014-10-30 ENCOUNTER — Emergency Department (HOSPITAL_COMMUNITY): Payer: Medicare Other

## 2014-10-30 ENCOUNTER — Encounter (HOSPITAL_COMMUNITY): Payer: Self-pay | Admitting: Emergency Medicine

## 2014-10-30 ENCOUNTER — Inpatient Hospital Stay (HOSPITAL_COMMUNITY)
Admission: EM | Admit: 2014-10-30 | Discharge: 2014-11-15 | DRG: 963 | Disposition: E | Payer: Medicare Other | Attending: Internal Medicine | Admitting: Internal Medicine

## 2014-10-30 DIAGNOSIS — R0902 Hypoxemia: Secondary | ICD-10-CM | POA: Diagnosis present

## 2014-10-30 DIAGNOSIS — I2510 Atherosclerotic heart disease of native coronary artery without angina pectoris: Secondary | ICD-10-CM | POA: Diagnosis present

## 2014-10-30 DIAGNOSIS — Z888 Allergy status to other drugs, medicaments and biological substances status: Secondary | ICD-10-CM

## 2014-10-30 DIAGNOSIS — M898X9 Other specified disorders of bone, unspecified site: Secondary | ICD-10-CM | POA: Diagnosis present

## 2014-10-30 DIAGNOSIS — Z87891 Personal history of nicotine dependence: Secondary | ICD-10-CM

## 2014-10-30 DIAGNOSIS — S7291XA Unspecified fracture of right femur, initial encounter for closed fracture: Secondary | ICD-10-CM | POA: Diagnosis present

## 2014-10-30 DIAGNOSIS — Z79899 Other long term (current) drug therapy: Secondary | ICD-10-CM

## 2014-10-30 DIAGNOSIS — S36031A Moderate laceration of spleen, initial encounter: Secondary | ICD-10-CM | POA: Diagnosis not present

## 2014-10-30 DIAGNOSIS — R011 Cardiac murmur, unspecified: Secondary | ICD-10-CM | POA: Diagnosis present

## 2014-10-30 DIAGNOSIS — E440 Moderate protein-calorie malnutrition: Secondary | ICD-10-CM | POA: Diagnosis present

## 2014-10-30 DIAGNOSIS — R161 Splenomegaly, not elsewhere classified: Secondary | ICD-10-CM | POA: Diagnosis present

## 2014-10-30 DIAGNOSIS — I482 Chronic atrial fibrillation, unspecified: Secondary | ICD-10-CM | POA: Diagnosis present

## 2014-10-30 DIAGNOSIS — Z993 Dependence on wheelchair: Secondary | ICD-10-CM

## 2014-10-30 DIAGNOSIS — Z515 Encounter for palliative care: Secondary | ICD-10-CM

## 2014-10-30 DIAGNOSIS — Z9841 Cataract extraction status, right eye: Secondary | ICD-10-CM

## 2014-10-30 DIAGNOSIS — Z8673 Personal history of transient ischemic attack (TIA), and cerebral infarction without residual deficits: Secondary | ICD-10-CM

## 2014-10-30 DIAGNOSIS — J811 Chronic pulmonary edema: Secondary | ICD-10-CM | POA: Diagnosis present

## 2014-10-30 DIAGNOSIS — R410 Disorientation, unspecified: Secondary | ICD-10-CM | POA: Diagnosis not present

## 2014-10-30 DIAGNOSIS — D469 Myelodysplastic syndrome, unspecified: Secondary | ICD-10-CM | POA: Diagnosis present

## 2014-10-30 DIAGNOSIS — N2581 Secondary hyperparathyroidism of renal origin: Secondary | ICD-10-CM | POA: Diagnosis present

## 2014-10-30 DIAGNOSIS — G9340 Encephalopathy, unspecified: Secondary | ICD-10-CM | POA: Diagnosis present

## 2014-10-30 DIAGNOSIS — Z933 Colostomy status: Secondary | ICD-10-CM

## 2014-10-30 DIAGNOSIS — Z992 Dependence on renal dialysis: Secondary | ICD-10-CM

## 2014-10-30 DIAGNOSIS — Z66 Do not resuscitate: Secondary | ICD-10-CM | POA: Diagnosis present

## 2014-10-30 DIAGNOSIS — S36039A Unspecified laceration of spleen, initial encounter: Secondary | ICD-10-CM

## 2014-10-30 DIAGNOSIS — Z6829 Body mass index (BMI) 29.0-29.9, adult: Secondary | ICD-10-CM

## 2014-10-30 DIAGNOSIS — D649 Anemia, unspecified: Secondary | ICD-10-CM | POA: Diagnosis present

## 2014-10-30 DIAGNOSIS — I252 Old myocardial infarction: Secondary | ICD-10-CM

## 2014-10-30 DIAGNOSIS — I959 Hypotension, unspecified: Secondary | ICD-10-CM | POA: Diagnosis present

## 2014-10-30 DIAGNOSIS — R4182 Altered mental status, unspecified: Secondary | ICD-10-CM

## 2014-10-30 DIAGNOSIS — D539 Nutritional anemia, unspecified: Secondary | ICD-10-CM | POA: Diagnosis present

## 2014-10-30 DIAGNOSIS — S72401A Unspecified fracture of lower end of right femur, initial encounter for closed fracture: Secondary | ICD-10-CM | POA: Diagnosis present

## 2014-10-30 DIAGNOSIS — H3530 Unspecified macular degeneration: Secondary | ICD-10-CM | POA: Diagnosis present

## 2014-10-30 DIAGNOSIS — I120 Hypertensive chronic kidney disease with stage 5 chronic kidney disease or end stage renal disease: Secondary | ICD-10-CM | POA: Diagnosis present

## 2014-10-30 DIAGNOSIS — G9341 Metabolic encephalopathy: Secondary | ICD-10-CM | POA: Diagnosis present

## 2014-10-30 DIAGNOSIS — I272 Other secondary pulmonary hypertension: Secondary | ICD-10-CM | POA: Diagnosis present

## 2014-10-30 DIAGNOSIS — T85598A Other mechanical complication of other gastrointestinal prosthetic devices, implants and grafts, initial encounter: Secondary | ICD-10-CM

## 2014-10-30 DIAGNOSIS — L8990 Pressure ulcer of unspecified site, unspecified stage: Secondary | ICD-10-CM | POA: Diagnosis present

## 2014-10-30 DIAGNOSIS — Z8612 Personal history of poliomyelitis: Secondary | ICD-10-CM

## 2014-10-30 DIAGNOSIS — M4856XA Collapsed vertebra, not elsewhere classified, lumbar region, initial encounter for fracture: Secondary | ICD-10-CM | POA: Diagnosis present

## 2014-10-30 DIAGNOSIS — N186 End stage renal disease: Secondary | ICD-10-CM | POA: Diagnosis present

## 2014-10-30 DIAGNOSIS — D62 Acute posthemorrhagic anemia: Secondary | ICD-10-CM | POA: Diagnosis present

## 2014-10-30 DIAGNOSIS — J9692 Respiratory failure, unspecified with hypercapnia: Secondary | ICD-10-CM | POA: Diagnosis not present

## 2014-10-30 DIAGNOSIS — E872 Acidosis: Secondary | ICD-10-CM | POA: Diagnosis not present

## 2014-10-30 DIAGNOSIS — W1809XA Striking against other object with subsequent fall, initial encounter: Secondary | ICD-10-CM

## 2014-10-30 DIAGNOSIS — Z09 Encounter for follow-up examination after completed treatment for conditions other than malignant neoplasm: Secondary | ICD-10-CM

## 2014-10-30 DIAGNOSIS — E213 Hyperparathyroidism, unspecified: Secondary | ICD-10-CM | POA: Diagnosis present

## 2014-10-30 DIAGNOSIS — Q2733 Arteriovenous malformation of digestive system vessel: Secondary | ICD-10-CM

## 2014-10-30 DIAGNOSIS — F0390 Unspecified dementia without behavioral disturbance: Secondary | ICD-10-CM | POA: Diagnosis present

## 2014-10-30 DIAGNOSIS — Z8711 Personal history of peptic ulcer disease: Secondary | ICD-10-CM

## 2014-10-30 DIAGNOSIS — E669 Obesity, unspecified: Secondary | ICD-10-CM | POA: Diagnosis present

## 2014-10-30 HISTORY — DX: Unspecified laceration of spleen, initial encounter: S36.039A

## 2014-10-30 HISTORY — DX: Unspecified fracture of shaft of unspecified femur, initial encounter for closed fracture: S72.309A

## 2014-10-30 LAB — CBC WITH DIFFERENTIAL/PLATELET
BASOS ABS: 0 10*3/uL (ref 0.0–0.1)
Basophils Relative: 0 % (ref 0–1)
EOS ABS: 0.1 10*3/uL (ref 0.0–0.7)
Eosinophils Relative: 1 % (ref 0–5)
HCT: 31.8 % — ABNORMAL LOW (ref 39.0–52.0)
HEMOGLOBIN: 10.6 g/dL — AB (ref 13.0–17.0)
LYMPHS PCT: 9 % — AB (ref 12–46)
Lymphs Abs: 0.9 10*3/uL (ref 0.7–4.0)
MCH: 37.3 pg — ABNORMAL HIGH (ref 26.0–34.0)
MCHC: 33.3 g/dL (ref 30.0–36.0)
MCV: 112 fL — ABNORMAL HIGH (ref 78.0–100.0)
MONO ABS: 1 10*3/uL (ref 0.1–1.0)
Monocytes Relative: 11 % (ref 3–12)
NEUTROS ABS: 7.5 10*3/uL (ref 1.7–7.7)
Neutrophils Relative %: 79 % — ABNORMAL HIGH (ref 43–77)
PLATELETS: 228 10*3/uL (ref 150–400)
RBC: 2.84 MIL/uL — ABNORMAL LOW (ref 4.22–5.81)
RDW: 14.9 % (ref 11.5–15.5)
WBC: 9.5 10*3/uL (ref 4.0–10.5)

## 2014-10-30 LAB — COMPREHENSIVE METABOLIC PANEL
ALBUMIN: 2.9 g/dL — AB (ref 3.5–5.0)
ALK PHOS: 128 U/L — AB (ref 38–126)
ALT: 9 U/L — ABNORMAL LOW (ref 17–63)
ANION GAP: 12 (ref 5–15)
AST: 26 U/L (ref 15–41)
BILIRUBIN TOTAL: 0.9 mg/dL (ref 0.3–1.2)
BUN: 28 mg/dL — ABNORMAL HIGH (ref 6–20)
CALCIUM: 8.8 mg/dL — AB (ref 8.9–10.3)
CO2: 32 mmol/L (ref 22–32)
Chloride: 91 mmol/L — ABNORMAL LOW (ref 101–111)
Creatinine, Ser: 3.75 mg/dL — ABNORMAL HIGH (ref 0.61–1.24)
GFR calc non Af Amer: 13 mL/min — ABNORMAL LOW (ref >60)
GFR, EST AFRICAN AMERICAN: 15 mL/min — AB (ref >60)
GLUCOSE: 120 mg/dL — AB (ref 65–99)
Potassium: 3.6 mmol/L (ref 3.5–5.1)
Sodium: 135 mmol/L (ref 135–145)
TOTAL PROTEIN: 7.9 g/dL (ref 6.5–8.1)

## 2014-10-30 LAB — LACTIC ACID, PLASMA
LACTIC ACID, VENOUS: 1.4 mmol/L (ref 0.5–2.0)
LACTIC ACID, VENOUS: 1.4 mmol/L (ref 0.5–2.0)

## 2014-10-30 MED ORDER — SODIUM CHLORIDE 0.9 % IV BOLUS (SEPSIS)
500.0000 mL | Freq: Once | INTRAVENOUS | Status: AC
  Administered 2014-10-30: 500 mL via INTRAVENOUS

## 2014-10-30 NOTE — ED Notes (Signed)
Unable to cath patient. Two unsuccessful attempts made.

## 2014-10-30 NOTE — ED Provider Notes (Signed)
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   3. Splenic laceration, initial encounter     Disposition: Admit  Condition: stable  I have discussed the results, Dx and Tx plan with the pt(& family if present). He/she/they expressed understanding and agree(s) with the plan.  Pt seen in conjunction with Dr. Lawerance Cruel, [facility] Resident - PGY-3        Kirstie Peri, MD 10/31/14 [id number]  Debby Freiberg, MD 11/04/14 [phone]

## 2014-10-30 NOTE — ED Notes (Addendum)
Pt stated he fell out of his motorized scooter this afternoon. Pt complaining of pain to right shoulder, hip, and leg.  His  right leg has an obvious deformity. Pt denies loss of consciousness and stated that he just slid out of his chair.

## 2014-10-31 ENCOUNTER — Encounter (HOSPITAL_COMMUNITY): Payer: Self-pay

## 2014-10-31 ENCOUNTER — Inpatient Hospital Stay (HOSPITAL_COMMUNITY): Payer: Medicare Other

## 2014-10-31 ENCOUNTER — Emergency Department (HOSPITAL_COMMUNITY): Payer: Medicare Other

## 2014-10-31 DIAGNOSIS — N2581 Secondary hyperparathyroidism of renal origin: Secondary | ICD-10-CM | POA: Diagnosis present

## 2014-10-31 DIAGNOSIS — I10 Essential (primary) hypertension: Secondary | ICD-10-CM | POA: Diagnosis not present

## 2014-10-31 DIAGNOSIS — S36039D Unspecified laceration of spleen, subsequent encounter: Secondary | ICD-10-CM | POA: Diagnosis not present

## 2014-10-31 DIAGNOSIS — R161 Splenomegaly, not elsewhere classified: Secondary | ICD-10-CM | POA: Diagnosis present

## 2014-10-31 DIAGNOSIS — Z87891 Personal history of nicotine dependence: Secondary | ICD-10-CM | POA: Diagnosis not present

## 2014-10-31 DIAGNOSIS — Z8673 Personal history of transient ischemic attack (TIA), and cerebral infarction without residual deficits: Secondary | ICD-10-CM | POA: Diagnosis not present

## 2014-10-31 DIAGNOSIS — I120 Hypertensive chronic kidney disease with stage 5 chronic kidney disease or end stage renal disease: Secondary | ICD-10-CM | POA: Diagnosis present

## 2014-10-31 DIAGNOSIS — Z8711 Personal history of peptic ulcer disease: Secondary | ICD-10-CM | POA: Diagnosis not present

## 2014-10-31 DIAGNOSIS — G9341 Metabolic encephalopathy: Secondary | ICD-10-CM | POA: Diagnosis present

## 2014-10-31 DIAGNOSIS — Z933 Colostomy status: Secondary | ICD-10-CM | POA: Diagnosis not present

## 2014-10-31 DIAGNOSIS — H3530 Unspecified macular degeneration: Secondary | ICD-10-CM | POA: Diagnosis present

## 2014-10-31 DIAGNOSIS — D539 Nutritional anemia, unspecified: Secondary | ICD-10-CM | POA: Diagnosis present

## 2014-10-31 DIAGNOSIS — N186 End stage renal disease: Secondary | ICD-10-CM | POA: Diagnosis not present

## 2014-10-31 DIAGNOSIS — Z888 Allergy status to other drugs, medicaments and biological substances status: Secondary | ICD-10-CM | POA: Diagnosis not present

## 2014-10-31 DIAGNOSIS — Z515 Encounter for palliative care: Secondary | ICD-10-CM | POA: Diagnosis not present

## 2014-10-31 DIAGNOSIS — Z66 Do not resuscitate: Secondary | ICD-10-CM | POA: Diagnosis present

## 2014-10-31 DIAGNOSIS — S36039A Unspecified laceration of spleen, initial encounter: Secondary | ICD-10-CM | POA: Diagnosis present

## 2014-10-31 DIAGNOSIS — Z9841 Cataract extraction status, right eye: Secondary | ICD-10-CM | POA: Diagnosis not present

## 2014-10-31 DIAGNOSIS — S36031A Moderate laceration of spleen, initial encounter: Secondary | ICD-10-CM | POA: Diagnosis present

## 2014-10-31 DIAGNOSIS — Z79899 Other long term (current) drug therapy: Secondary | ICD-10-CM | POA: Diagnosis not present

## 2014-10-31 DIAGNOSIS — I272 Other secondary pulmonary hypertension: Secondary | ICD-10-CM | POA: Diagnosis present

## 2014-10-31 DIAGNOSIS — E669 Obesity, unspecified: Secondary | ICD-10-CM | POA: Diagnosis present

## 2014-10-31 DIAGNOSIS — M898X9 Other specified disorders of bone, unspecified site: Secondary | ICD-10-CM | POA: Diagnosis present

## 2014-10-31 DIAGNOSIS — Z993 Dependence on wheelchair: Secondary | ICD-10-CM | POA: Diagnosis not present

## 2014-10-31 DIAGNOSIS — I482 Chronic atrial fibrillation: Secondary | ICD-10-CM | POA: Diagnosis not present

## 2014-10-31 DIAGNOSIS — Q2733 Arteriovenous malformation of digestive system vessel: Secondary | ICD-10-CM | POA: Diagnosis not present

## 2014-10-31 DIAGNOSIS — D649 Anemia, unspecified: Secondary | ICD-10-CM | POA: Diagnosis present

## 2014-10-31 DIAGNOSIS — Z6829 Body mass index (BMI) 29.0-29.9, adult: Secondary | ICD-10-CM | POA: Diagnosis not present

## 2014-10-31 DIAGNOSIS — S72401A Unspecified fracture of lower end of right femur, initial encounter for closed fracture: Secondary | ICD-10-CM | POA: Diagnosis present

## 2014-10-31 DIAGNOSIS — R0902 Hypoxemia: Secondary | ICD-10-CM | POA: Diagnosis present

## 2014-10-31 DIAGNOSIS — I2510 Atherosclerotic heart disease of native coronary artery without angina pectoris: Secondary | ICD-10-CM | POA: Diagnosis present

## 2014-10-31 DIAGNOSIS — G9340 Encephalopathy, unspecified: Secondary | ICD-10-CM | POA: Diagnosis not present

## 2014-10-31 DIAGNOSIS — J9692 Respiratory failure, unspecified with hypercapnia: Secondary | ICD-10-CM | POA: Diagnosis not present

## 2014-10-31 DIAGNOSIS — L8990 Pressure ulcer of unspecified site, unspecified stage: Secondary | ICD-10-CM | POA: Diagnosis present

## 2014-10-31 DIAGNOSIS — Z992 Dependence on renal dialysis: Secondary | ICD-10-CM | POA: Diagnosis not present

## 2014-10-31 DIAGNOSIS — R011 Cardiac murmur, unspecified: Secondary | ICD-10-CM | POA: Diagnosis present

## 2014-10-31 DIAGNOSIS — I252 Old myocardial infarction: Secondary | ICD-10-CM | POA: Diagnosis not present

## 2014-10-31 DIAGNOSIS — R410 Disorientation, unspecified: Secondary | ICD-10-CM | POA: Diagnosis present

## 2014-10-31 DIAGNOSIS — D62 Acute posthemorrhagic anemia: Secondary | ICD-10-CM | POA: Diagnosis present

## 2014-10-31 DIAGNOSIS — E440 Moderate protein-calorie malnutrition: Secondary | ICD-10-CM | POA: Diagnosis present

## 2014-10-31 DIAGNOSIS — S7291XA Unspecified fracture of right femur, initial encounter for closed fracture: Secondary | ICD-10-CM

## 2014-10-31 DIAGNOSIS — D469 Myelodysplastic syndrome, unspecified: Secondary | ICD-10-CM | POA: Diagnosis present

## 2014-10-31 DIAGNOSIS — F0390 Unspecified dementia without behavioral disturbance: Secondary | ICD-10-CM | POA: Diagnosis present

## 2014-10-31 DIAGNOSIS — Z8612 Personal history of poliomyelitis: Secondary | ICD-10-CM | POA: Diagnosis not present

## 2014-10-31 DIAGNOSIS — I959 Hypotension, unspecified: Secondary | ICD-10-CM | POA: Diagnosis present

## 2014-10-31 DIAGNOSIS — M4856XA Collapsed vertebra, not elsewhere classified, lumbar region, initial encounter for fracture: Secondary | ICD-10-CM | POA: Diagnosis present

## 2014-10-31 DIAGNOSIS — S7291XD Unspecified fracture of right femur, subsequent encounter for closed fracture with routine healing: Secondary | ICD-10-CM | POA: Diagnosis not present

## 2014-10-31 DIAGNOSIS — J811 Chronic pulmonary edema: Secondary | ICD-10-CM | POA: Diagnosis present

## 2014-10-31 DIAGNOSIS — E872 Acidosis: Secondary | ICD-10-CM | POA: Diagnosis not present

## 2014-10-31 DIAGNOSIS — E213 Hyperparathyroidism, unspecified: Secondary | ICD-10-CM | POA: Diagnosis present

## 2014-10-31 LAB — CBC
HCT: 32.8 % — ABNORMAL LOW (ref 39.0–52.0)
HCT: 33.3 % — ABNORMAL LOW (ref 39.0–52.0)
HEMATOCRIT: 31.9 % — AB (ref 39.0–52.0)
HEMATOCRIT: 28.2 % — AB (ref 39.0–52.0)
HEMOGLOBIN: 10.9 g/dL — AB (ref 13.0–17.0)
Hemoglobin: 10.5 g/dL — ABNORMAL LOW (ref 13.0–17.0)
Hemoglobin: 9.5 g/dL — ABNORMAL LOW (ref 13.0–17.0)
Hemoglobin: 11 g/dL — ABNORMAL LOW (ref 13.0–17.0)
MCH: 36.6 pg — ABNORMAL HIGH (ref 26.0–34.0)
MCH: 37.2 pg — AB (ref 26.0–34.0)
MCH: 37.8 pg — AB (ref 26.0–34.0)
MCH: 36.9 pg — ABNORMAL HIGH (ref 26.0–34.0)
MCHC: 32.9 g/dL (ref 30.0–36.0)
MCHC: 33.2 g/dL (ref 30.0–36.0)
MCHC: 33.7 g/dL (ref 30.0–36.0)
MCHC: 33 g/dL (ref 30.0–36.0)
MCV: 111.1 fL — AB (ref 78.0–100.0)
MCV: 111.9 fL — ABNORMAL HIGH (ref 78.0–100.0)
MCV: 112.4 fL — AB (ref 78.0–100.0)
MCV: 111.7 fL — ABNORMAL HIGH (ref 78.0–100.0)
PLATELETS: 232 10*3/uL (ref 150–400)
PLATELETS: 224 10*3/uL (ref 150–400)
PLATELETS: 219 10*3/uL (ref 150–400)
Platelets: 180 10*3/uL (ref 150–400)
RBC: 2.87 MIL/uL — ABNORMAL LOW (ref 4.22–5.81)
RBC: 2.93 MIL/uL — ABNORMAL LOW (ref 4.22–5.81)
RBC: 2.51 MIL/uL — ABNORMAL LOW (ref 4.22–5.81)
RBC: 2.98 MIL/uL — ABNORMAL LOW (ref 4.22–5.81)
RDW: 15 % (ref 11.5–15.5)
RDW: 14.9 % (ref 11.5–15.5)
RDW: 14.8 % (ref 11.5–15.5)
RDW: 15 % (ref 11.5–15.5)
WBC: 10.1 10*3/uL (ref 4.0–10.5)
WBC: 9 10*3/uL (ref 4.0–10.5)
WBC: 9.4 10*3/uL (ref 4.0–10.5)
WBC: 12.7 10*3/uL — ABNORMAL HIGH (ref 4.0–10.5)

## 2014-10-31 LAB — TYPE AND SCREEN
ABO/RH(D): A POS
Antibody Screen: NEGATIVE

## 2014-10-31 LAB — COMPREHENSIVE METABOLIC PANEL
ALT: 9 U/L — AB (ref 17–63)
AST: 25 U/L (ref 15–41)
Albumin: 3 g/dL — ABNORMAL LOW (ref 3.5–5.0)
Alkaline Phosphatase: 124 U/L (ref 38–126)
Anion gap: 13 (ref 5–15)
BILIRUBIN TOTAL: 1.2 mg/dL (ref 0.3–1.2)
BUN: 32 mg/dL — AB (ref 6–20)
CHLORIDE: 92 mmol/L — AB (ref 101–111)
CO2: 27 mmol/L (ref 22–32)
CREATININE: 3.92 mg/dL — AB (ref 0.61–1.24)
Calcium: 8.5 mg/dL — ABNORMAL LOW (ref 8.9–10.3)
GFR, EST AFRICAN AMERICAN: 14 mL/min — AB (ref >60)
GFR, EST NON AFRICAN AMERICAN: 12 mL/min — AB (ref >60)
Glucose, Bld: 104 mg/dL — ABNORMAL HIGH (ref 65–99)
Potassium: 3.7 mmol/L (ref 3.5–5.1)
Sodium: 132 mmol/L — ABNORMAL LOW (ref 135–145)
TOTAL PROTEIN: 7.9 g/dL (ref 6.5–8.1)

## 2014-10-31 LAB — GLUCOSE, CAPILLARY: GLUCOSE-CAPILLARY: 88 mg/dL (ref 65–99)

## 2014-10-31 LAB — CK: CK TOTAL: 28 U/L — AB (ref 49–397)

## 2014-10-31 LAB — TROPONIN I: Troponin I: 0.13 ng/mL — ABNORMAL HIGH

## 2014-10-31 LAB — AMMONIA: Ammonia: 42 umol/L — ABNORMAL HIGH (ref 9–35)

## 2014-10-31 MED ORDER — LEVALBUTEROL HCL 0.63 MG/3ML IN NEBU: 0.6300 mg | INHALATION_SOLUTION | Freq: Four times a day (QID) | RESPIRATORY_TRACT | Status: DC | PRN

## 2014-10-31 MED ORDER — ACETAMINOPHEN 650 MG RE SUPP: 650.0000 mg | Freq: Four times a day (QID) | RECTAL | Status: DC | PRN

## 2014-10-31 MED ORDER — ONDANSETRON HCL 4 MG PO TABS: 4.0000 mg | ORAL_TABLET | ORAL | Status: DC | PRN

## 2014-10-31 MED ORDER — SODIUM CHLORIDE 0.9 % IV SOLN: 125.0000 mg | INTRAVENOUS | Status: DC

## 2014-10-31 MED ORDER — CARVEDILOL 6.25 MG PO TABS
6.2500 mg | ORAL_TABLET | Freq: Two times a day (BID) | ORAL | Status: DC
  Administered 2014-10-31: 6.25 mg via ORAL
  Filled 2014-10-31: qty 1

## 2014-10-31 MED ORDER — ONDANSETRON HCL 4 MG/2ML IJ SOLN: 4.0000 mg | Freq: Four times a day (QID) | INTRAMUSCULAR | Status: DC | PRN

## 2014-10-31 MED ORDER — PREDNISOLONE ACETATE 1 % OP SUSP
1.0000 [drp] | Freq: Two times a day (BID) | OPHTHALMIC | Status: DC
  Administered 2014-10-31 – 2014-11-01 (×3): 1 [drp] via OPHTHALMIC
  Filled 2014-10-31 (×2): qty 1

## 2014-10-31 MED ORDER — ACYCLOVIR 800 MG PO TABS
800.0000 mg | ORAL_TABLET | ORAL | Status: DC
  Administered 2014-10-31 – 2014-11-03 (×3): 800 mg via ORAL
  Filled 2014-10-31 (×3): qty 2
  Filled 2014-10-31: qty 1

## 2014-10-31 MED ORDER — PANTOPRAZOLE SODIUM 40 MG PO TBEC
40.0000 mg | DELAYED_RELEASE_TABLET | Freq: Every day | ORAL | Status: DC
  Administered 2014-10-31 – 2014-11-03 (×3): 40 mg via ORAL
  Filled 2014-10-31 (×3): qty 1

## 2014-10-31 MED ORDER — FEBUXOSTAT 40 MG PO TABS
40.0000 mg | ORAL_TABLET | Freq: Every day | ORAL | Status: DC
  Administered 2014-10-31 – 2014-11-03 (×3): 40 mg via ORAL
  Filled 2014-10-31 (×7): qty 1

## 2014-10-31 MED ORDER — RENA-VITE PO TABS
1.0000 | ORAL_TABLET | Freq: Every day | ORAL | Status: DC
  Administered 2014-10-31 – 2014-11-03 (×4): 1 via ORAL
  Filled 2014-10-31 (×6): qty 1

## 2014-10-31 MED ORDER — ACETAMINOPHEN 325 MG PO TABS
650.0000 mg | ORAL_TABLET | Freq: Four times a day (QID) | ORAL | Status: DC | PRN
  Administered 2014-11-01 – 2014-11-02 (×2): 650 mg via ORAL
  Filled 2014-10-31 (×2): qty 2

## 2014-10-31 MED ORDER — SEVELAMER CARBONATE 800 MG PO TABS
1600.0000 mg | ORAL_TABLET | Freq: Three times a day (TID) | ORAL | Status: DC
  Administered 2014-10-31 – 2014-11-03 (×7): 1600 mg via ORAL
  Filled 2014-10-31 (×19): qty 2

## 2014-10-31 MED ORDER — SODIUM CHLORIDE 0.9 % IJ SOLN
3.0000 mL | Freq: Two times a day (BID) | INTRAMUSCULAR | Status: DC
  Administered 2014-10-31 – 2014-11-05 (×8): 3 mL via INTRAVENOUS

## 2014-10-31 MED ORDER — TRAMADOL HCL 50 MG PO TABS: 50.0000 mg | ORAL_TABLET | Freq: Two times a day (BID) | ORAL | Status: DC | PRN

## 2014-10-31 MED ORDER — ONDANSETRON HCL 4 MG PO TABS: 4.0000 mg | ORAL_TABLET | Freq: Four times a day (QID) | ORAL | Status: DC | PRN

## 2014-10-31 MED ORDER — DOCUSATE SODIUM 100 MG PO CAPS
100.0000 mg | ORAL_CAPSULE | Freq: Two times a day (BID) | ORAL | Status: DC
  Administered 2014-10-31 – 2014-11-03 (×6): 100 mg via ORAL
  Filled 2014-10-31 (×10): qty 1

## 2014-10-31 MED ORDER — BETA CAROTENE 25000 UNITS PO CAPS: 25000.0000 [IU] | ORAL_CAPSULE | Freq: Every day | ORAL | Status: DC

## 2014-10-31 MED ORDER — ANAGRELIDE HCL 0.5 MG PO CAPS
0.5000 mg | ORAL_CAPSULE | ORAL | Status: DC
  Administered 2014-10-31: 0.5 mg via ORAL
  Filled 2014-10-31 (×4): qty 1

## 2014-10-31 MED ORDER — CINACALCET HCL 30 MG PO TABS
30.0000 mg | ORAL_TABLET | Freq: Every day | ORAL | Status: DC
  Administered 2014-10-31 – 2014-11-02 (×3): 30 mg via ORAL
  Filled 2014-10-31 (×5): qty 1

## 2014-10-31 MED ORDER — DARBEPOETIN ALFA 100 MCG/0.5ML IJ SOSY: 100.0000 ug | PREFILLED_SYRINGE | INTRAMUSCULAR | Status: DC

## 2014-10-31 MED ORDER — FINASTERIDE 5 MG PO TABS
5.0000 mg | ORAL_TABLET | Freq: Every day | ORAL | Status: DC
  Administered 2014-10-31 – 2014-11-03 (×3): 5 mg via ORAL
  Filled 2014-10-31 (×6): qty 1

## 2014-10-31 MED ORDER — MORPHINE SULFATE (PF) 2 MG/ML IV SOLN: 1.0000 mg | INTRAVENOUS | Status: DC | PRN

## 2014-10-31 MED ORDER — LORATADINE 10 MG PO TABS: 10.0000 mg | ORAL_TABLET | Freq: Every day | ORAL | Status: DC | PRN

## 2014-10-31 MED ORDER — CARVEDILOL 6.25 MG PO TABS
6.2500 mg | ORAL_TABLET | Freq: Two times a day (BID) | ORAL | Status: DC
  Administered 2014-10-31 – 2014-11-01 (×3): 6.25 mg via ORAL
  Filled 2014-10-31 (×3): qty 1

## 2014-10-31 MED ORDER — METOCLOPRAMIDE HCL 5 MG PO TABS
5.0000 mg | ORAL_TABLET | Freq: Three times a day (TID) | ORAL | Status: DC
  Administered 2014-10-31 – 2014-11-03 (×9): 5 mg via ORAL
  Filled 2014-10-31 (×9): qty 1

## 2014-10-31 NOTE — Progress Notes (Signed)
Patient ID: Brent Mcneil, male   DOB: June 04, 1923, 81 y.o.   MRN: [id number]   LOS: 0 days   Subjective: Denies abd pain. Mostly chest, leg, and ankle pain.   Objective: Vital signs in last 24 hours: Temp:  [97.7 F (36.5 C)-97.9 F (36.6 C)] 97.7 F (36.5 C) (08/17 0309) Pulse Rate:  [96-114] 100 (08/17 0309) Resp:  [18-22] 19 (08/17 0309) BP: (86-111)/(50-76) 98/53 mmHg (08/17 0309) SpO2:  [97 %-100 %] 100 % (08/17 0309) Last BM Date: 10/31/14   Laboratory  CBC  Recent Labs  10/31/14 0456 10/31/14 0814  WBC 10.1 9.0  HGB 10.5* 10.9*  HCT 31.9* 32.8*  PLT 232 224   BMET  Recent Labs  11/11/2014 2140 10/31/14 0456  NA 135 132*  K 3.6 3.7  CL 91* 92*  CO2 32 27  GLUCOSE 120* 104*  BUN 28* 32*  CREATININE 3.75* 3.92*  CALCIUM 8.8* 8.5*    Physical Exam General appearance: alert and no distress Resp: clear to auscultation bilaterally Cardio: regular rate and rhythm GI: normal findings: bowel sounds normal and soft, non-tender Extremities: Mild TTP anterior right ankle   Assessment/Plan: Fall Grade 3 splenic lac -- Plan bedrest for 72h, serial hgb's. Stable thus far. Right femur fx -- Non-operative In Bledsoe brace per Dr. Percell Miller ABL anemia -- Stable Multiple medical problems -- per primary team    Lisette Abu, PA-C Pager: [phone] General Trauma PA Pager: [phone]  10/31/2014

## 2014-10-31 NOTE — Consult Note (Signed)
[facility] Renal Consultation Note  Indication for Consultation:  Management of ESRD/hemodialysis; anemia, hypertension/volume and secondary hyperparathyroidism  HPI: Brent Mcneil is a 81 y.o. male admitted sp reported Fall at [facility] (with no head trauma reported) "slipped out of motorized Wheelchair" with Splenic Laceration  and R femur Fracture. He has ho polio (wheelchair , Hoyerlift wts [facility]). At op [facility] center ,some lowish blood pressures and leaving above EDW,(no reported dialysis  prob. Per op RN at kidney Center) Noted per ER history ="'Per daughter patient's pain medications were discontinued last month but since patient is complaining of increasing pain in his low back since Friday patient was placed back on oxycodone. "" Now in room and daughter present has no reported pain .      Past Medical History  Diagnosis Date  . Chronic kidney disease     HD- M-W-F  . End stage renal disease   . Uremic encephalopathy     Metabolic  . Anemia   . Gastrointestinal bleed   . Ulcer     Peptic and gastric   . Ulcerative esophagitis   . Myeloproliferative disorder   . Macular degeneration 1990s    bilateral  . History of thrombocytosis   . Myocardial infarction 1996  . Polio age 26  . Pneumonia   . Stroke     approx 6-7 years ago, affected speech for a short time  . Atrial flutter   . Atrial fibrillation 08/2012    treated with Carvedilol, Echo done 09/22/12    Past Surgical History  Procedure Laterality Date  . Colon resection  09-2006  . Tonsillectomy  1958  . Orthopedic surgeries  as a child    due to polio  . Cataract surgery Right 2004  . Colon surgery      with Colostomy  . Av fistula placement Left 08/18/2012    Procedure: ARTERIOVENOUS (AV) FISTULA CREATION;  Surgeon: Serafina Mitchell, MD;  Location: [facility];  Service: Vascular;  Laterality: Left;  Marland Kitchen Eye surgery Right     cataract  . Cardiac catheterization  1996  . Av fistula placement  Left 10/13/2012    Procedure: ARTERIOVENOUS (AV) FISTULA CREATION;  Surgeon: Serafina Mitchell, MD;  Location: [facility] OR;  Service: Vascular;  Laterality: Left;      Family History  Problem Relation Age of Onset  . Cancer Father   . Lymphoma Brother   . Lung cancer Sister   . Emphysema Sister       reports that he quit smoking about 58 years ago. He has quit using smokeless tobacco. His smokeless tobacco use included Chew. He reports that he does not drink alcohol or use illicit drugs.   Allergies  Allergen Reactions  . Lubiprostone Diarrhea    Prior to Admission medications   Medication Sig Start Date End Date Taking? Authorizing Provider  acetaminophen (TYLENOL) 500 MG tablet Take 1,000 mg by mouth every 6 (six) hours as needed for fever (pain).   Yes Historical Provider, MD  acyclovir (ZOVIRAX) 800 MG tablet Take 800 mg by mouth See admin instructions. On Monday, Wednesday, Friday and Saturday at bedtime   Yes Historical Provider, MD  anagrelide (AGRYLIN) 0.5 MG capsule Take 0.5 mg by mouth every other day.   Yes Historical Provider, MD  B Complex-C-Folic Acid (FULL SPECTRUM B/VITAMIN C PO) Take 1 tablet by mouth daily.   Yes Historical Provider, MD  beta carotene 25000 UNIT capsule Take 25,000  Units by mouth daily.    Yes Historical Provider, MD  carvedilol (COREG) 6.25 MG tablet Take 6.25 mg by mouth See admin instructions. Take 1 tablet (6.25 mg) twice daily, except: hold morning dose on Monday, Wednesday and Friday if SBP<110   Yes Historical Provider, MD  cinacalcet (SENSIPAR) 30 MG tablet Take 30 mg by mouth daily with supper.    Yes Historical Provider, MD  diphenhydrAMINE (BENADRYL) 12.5 MG/5ML liquid Take 12.5 mg by mouth every 6 (six) hours as needed for itching.   Yes Historical Provider, MD  docusate sodium (COLACE) 100 MG capsule Take 100 mg by mouth 2 (two) times daily.   Yes Historical Provider, MD  febuxostat (ULORIC) 40 MG tablet Take 40 mg by mouth daily.   Yes Historical  Provider, MD  finasteride (PROSCAR) 5 MG tablet Take 5 mg by mouth daily.    Yes Historical Provider, MD  ketoconazole (NIZORAL) 2 % shampoo Apply 1 application topically daily.   Yes Historical Provider, MD  loratadine (CLARITIN) 10 MG tablet Take 10 mg by mouth daily as needed for allergies.   Yes Historical Provider, MD  metoCLOPramide (REGLAN) 5 MG tablet Take 5 mg by mouth 4 (four) times daily -  before meals and at bedtime.   Yes Historical Provider, MD  miconazole (BAZA ANTIFUNGAL) 2 % cream Apply 1 application topically as needed.   Yes Historical Provider, MD  omeprazole (PRILOSEC) 20 MG capsule Take 40 mg by mouth daily.   Yes Historical Provider, MD  ondansetron (ZOFRAN) 4 MG tablet Take 4 mg by mouth every 4 (four) hours as needed for vomiting.   Yes Historical Provider, MD  Oxycodone HCl 10 MG TABS Take 10 mg by mouth 2 (two) times daily as needed (for pain control).   Yes Historical Provider, MD  polyvinyl alcohol (LIQUIFILM TEARS) 1.4 % ophthalmic solution Place 1 drop into both eyes every 2 (two) hours as needed for dry eyes.   Yes Historical Provider, MD  Povidone-Iodine 10 % MISC Apply topically daily. To 5th MTP ulcer and 4th and 5th toe ulcers   Yes Historical Provider, MD  prednisoLONE acetate (PRED FORTE) 1 % ophthalmic suspension Place 1 drop into the left eye 2 (two) times daily.   Yes Historical Provider, MD  sevelamer carbonate (RENVELA) 800 MG tablet Take 1,600 mg by mouth 3 (three) times daily with meals.   Yes Historical Provider, MD  SIMPLY SALINE NA Apply topically daily. To clean wounds on the top of right foot and right 4th toe   Yes Historical Provider, MD  traMADol (ULTRAM) 50 MG tablet Take 50 mg by mouth every 6 (six) hours as needed (pain).    Yes Historical Provider, MD  acetaminophen (TYLENOL) 325 MG tablet Take 1 tablet (325 mg total) by mouth every 6 (six) hours as needed for moderate pain (take with oxycodone until percocet arrives.  Do not take with  percocet). Patient not taking: Reported on 04/01/2014 10/15/13   [name], MD     Anti-infectives    Start     Dose/Rate Route Frequency Ordered Stop   10/31/14 2200  acyclovir (ZOVIRAX) tablet 800 mg     800 mg Oral Once per day on Mon Wed Fri Sat 10/31/14 0359        Results for orders placed or performed during the hospital encounter of 10/31/2014 (from the past 48 hour(s))  Lactic acid, plasma     Status: None   Collection Time: 11/10/2014  9:29 PM  Result Value Ref Range   Lactic Acid, Venous 1.4 0.5 - 2.0 mmol/L  Comprehensive metabolic panel     Status: Abnormal   Collection Time: 10/19/2014  9:40 PM  Result Value Ref Range   Sodium 135 135 - 145 mmol/L   Potassium 3.6 3.5 - 5.1 mmol/L   Chloride 91 (L) 101 - 111 mmol/L   CO2 32 22 - 32 mmol/L   Glucose, Bld 120 (H) 65 - 99 mg/dL   BUN 28 (H) 6 - 20 mg/dL   Creatinine, Ser 3.75 (H) 0.61 - 1.24 mg/dL   Calcium 8.8 (L) 8.9 - 10.3 mg/dL   Total Protein 7.9 6.5 - 8.1 g/dL   Albumin 2.9 (L) 3.5 - 5.0 g/dL   AST 26 15 - 41 U/L   ALT 9 (L) 17 - 63 U/L   Alkaline Phosphatase 128 (H) 38 - 126 U/L   Total Bilirubin 0.9 0.3 - 1.2 mg/dL   GFR calc non Af Amer 13 (L) >60 mL/min   GFR calc Af Amer 15 (L) >60 mL/min    Comment: (NOTE) The eGFR has been calculated using the CKD EPI equation. This calculation has not been validated in all clinical situations. eGFR's persistently <60 mL/min signify possible Chronic Kidney Disease.    Anion gap 12 5 - 15  CBC with Differential     Status: Abnormal   Collection Time: 10/26/2014  9:40 PM  Result Value Ref Range   WBC 9.5 4.0 - 10.5 K/uL   RBC 2.84 (L) 4.22 - 5.81 MIL/uL   Hemoglobin 10.6 (L) 13.0 - 17.0 g/dL   HCT 31.8 (L) 39.0 - 52.0 %   MCV 112.0 (H) 78.0 - 100.0 fL   MCH 37.3 (H) 26.0 - 34.0 pg   MCHC 33.3 30.0 - 36.0 g/dL   RDW 14.9 11.5 - 15.5 %   Platelets 228 150 - 400 K/uL   Neutrophils Relative % 79 (H) 43 - 77 %   Lymphocytes Relative 9 (L) 12 - 46 %   Monocytes  Relative 11 3 - 12 %   Eosinophils Relative 1 0 - 5 %   Basophils Relative 0 0 - 1 %   Neutro Abs 7.5 1.7 - 7.7 K/uL   Lymphs Abs 0.9 0.7 - 4.0 K/uL   Monocytes Absolute 1.0 0.1 - 1.0 K/uL   Eosinophils Absolute 0.1 0.0 - 0.7 K/uL   Basophils Absolute 0.0 0.0 - 0.1 K/uL   RBC Morphology POLYCHROMASIA PRESENT     Comment: BASOPHILIC STIPPLING  Lactic acid, plasma     Status: None   Collection Time: 10/29/2014  9:45 PM  Result Value Ref Range   Lactic Acid, Venous 1.4 0.5 - 2.0 mmol/L  CBC     Status: Abnormal   Collection Time: 10/31/14  4:56 AM  Result Value Ref Range   WBC 10.1 4.0 - 10.5 K/uL   RBC 2.87 (L) 4.22 - 5.81 MIL/uL   Hemoglobin 10.5 (L) 13.0 - 17.0 g/dL   HCT 31.9 (L) 39.0 - 52.0 %   MCV 111.1 (H) 78.0 - 100.0 fL   MCH 36.6 (H) 26.0 - 34.0 pg   MCHC 32.9 30.0 - 36.0 g/dL   RDW 15.0 11.5 - 15.5 %   Platelets 232 150 - 400 K/uL  Troponin I     Status: Abnormal   Collection Time: 10/31/14  4:56 AM  Result Value Ref Range   Troponin I 0.13 (H) <0.031 ng/mL    Comment:  PERSISTENTLY INCREASED TROPONIN VALUES IN THE RANGE OF 0.04-0.49 ng/mL CAN BE SEEN IN:       -UNSTABLE ANGINA       -CONGESTIVE HEART FAILURE       -MYOCARDITIS       -CHEST TRAUMA       -ARRYHTHMIAS       -LATE PRESENTING MYOCARDIAL INFARCTION       -COPD   CLINICAL FOLLOW-UP RECOMMENDED.   CK     Status: Abnormal   Collection Time: 10/31/14  4:56 AM  Result Value Ref Range   Total CK 28 (L) 49 - 397 U/L  Comprehensive metabolic panel     Status: Abnormal   Collection Time: 10/31/14  4:56 AM  Result Value Ref Range   Sodium 132 (L) 135 - 145 mmol/L   Potassium 3.7 3.5 - 5.1 mmol/L   Chloride 92 (L) 101 - 111 mmol/L   CO2 27 22 - 32 mmol/L   Glucose, Bld 104 (H) 65 - 99 mg/dL   BUN 32 (H) 6 - 20 mg/dL   Creatinine, Ser 3.92 (H) 0.61 - 1.24 mg/dL   Calcium 8.5 (L) 8.9 - 10.3 mg/dL   Total Protein 7.9 6.5 - 8.1 g/dL   Albumin 3.0 (L) 3.5 - 5.0 g/dL   AST 25 15 - 41 U/L   ALT 9 (L)  17 - 63 U/L   Alkaline Phosphatase 124 38 - 126 U/L   Total Bilirubin 1.2 0.3 - 1.2 mg/dL   GFR calc non Af Amer 12 (L) >60 mL/min   GFR calc Af Amer 14 (L) >60 mL/min    Comment: (NOTE) The eGFR has been calculated using the CKD EPI equation. This calculation has not been validated in all clinical situations. eGFR's persistently <60 mL/min signify possible Chronic Kidney Disease.    Anion gap 13 5 - 15  Ammonia     Status: Abnormal   Collection Time: 10/31/14  4:56 AM  Result Value Ref Range   Ammonia 42 (H) 9 - 35 umol/L  Type and screen     Status: None   Collection Time: 10/31/14  4:56 AM  Result Value Ref Range   ABO/RH(D) A POS    Antibody Screen NEG    Sample Expiration 11/03/2014   CBC     Status: Abnormal   Collection Time: 10/31/14  8:14 AM  Result Value Ref Range   WBC 9.0 4.0 - 10.5 K/uL   RBC 2.93 (L) 4.22 - 5.81 MIL/uL   Hemoglobin 10.9 (L) 13.0 - 17.0 g/dL   HCT 32.8 (L) 39.0 - 52.0 %   MCV 111.9 (H) 78.0 - 100.0 fL   MCH 37.2 (H) 26.0 - 34.0 pg   MCHC 33.2 30.0 - 36.0 g/dL   RDW 14.9 11.5 - 15.5 %   Platelets 224 150 - 400 K/uL     ROS:  Only as in HPI no reported fevers , chills , sweats , chest pain , abd pain  , constipation or diarrhea   Physical Exam: Filed Vitals:   10/31/14 0309  BP: 98/53  Pulse: 100  Temp: 97.7 F (36.5 C)  Resp: 19     General: elderly WM Chronically ill (daughter in Rm) alert/  NAD/ no w OX3 HEENT: NV, mmm, nonicteric Neck: supple , no jvd Heart: Irreg, Irreg with vr stable 80s on my exam / soft 1/6 sem lsb ,no rub Lungs: sl decr bases otherwise CTA Abdomen: BS+, soft nontender ( did not do  deep pal .with spleen laceration ) Extremities: bilat lower extrem edema (appearance of chronic edema bilat ) R lower extrem  Skin: no overt rash/doughy appearance Neuro: drowsy but OX3 /not able to move lower extrem with ho polio Dialysis Access: L UA AVF pos bruit   Dialysis Orders: Center: [facility]  on [facility] . EDW 94.5kg HD Bath  2k,2ca  Time 4hrs 22mn Heparin 2800 bolus /1400 intermittent. Access AVF BFR 450 DFR af1.5     Mircera 727m q 4wks(11/14/14 next dose)   Units IV/HD  Venofer  5046mkly hd    Assessment/Plan 1. SP Fall with Splenic Laceration and R fem fracture- per admit team/ [facility]  and ortho/ Use no heparin on HD  2. Acute encephalopathy- Possibly med related  With some dementia in [age] pt.lives at [facility]/ appears alert and Ox3 now. 3. ESRD -  Normal schedule  MWF  3.7 k (reck labs pre hd ) no  Hep hd with splenic laceration  4. Hypertension/volume  - low bp in hosp and  At op unit not making edw= Incr edw/(hoyer wts at op unit) 5. Anemia  -hgb 9.7  Weekly fe on hd and esa wkly on hd give today 150 aranesp  6. History of myelodysplastic syndrome 7. Metabolic bone disease -   No vit d listed at kid center /  Renvela binder with diet 8. Chronic A Fib - ho gi bld ( not anticoag candidate) 9. HO Polio- wheelchair ambulation baseline/( HHarrel Lemonft wts as op kid center)  DavErnest HaberA-C [id number], 11:50 AM
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
## 2014-10-31 NOTE — Progress Notes (Signed)
Orthopedic Tech Progress Note Patient Details:  Brent Mcneil June 08, 1923 [id number]  Patient ID: [name], male   DOB: 1923-08-02, 81 y.o.   MRN: [id number] Called in bio-tech brace order; spoke with Dolores Lory, Miral Hoopes 10/31/2014, 9:22 AM

## 2014-10-31 NOTE — Procedures (Signed)
Patient seen on Hemodialysis. QB 400, UF goal Keeping even Treatment adjusted as needed.  Brent Shiley MD [facility]. Office # [phone] Pager # [phone] 4:28 PM
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
## 2014-11-01 ENCOUNTER — Encounter (HOSPITAL_COMMUNITY): Payer: Self-pay | Admitting: General Practice

## 2014-11-01 DIAGNOSIS — Z992 Dependence on renal dialysis: Secondary | ICD-10-CM

## 2014-11-01 DIAGNOSIS — N186 End stage renal disease: Secondary | ICD-10-CM

## 2014-11-01 DIAGNOSIS — I482 Chronic atrial fibrillation: Secondary | ICD-10-CM

## 2014-11-01 DIAGNOSIS — S7291XD Unspecified fracture of right femur, subsequent encounter for closed fracture with routine healing: Secondary | ICD-10-CM

## 2014-11-01 DIAGNOSIS — D649 Anemia, unspecified: Secondary | ICD-10-CM

## 2014-11-01 LAB — RENAL FUNCTION PANEL
ALBUMIN: 2.8 g/dL — AB (ref 3.5–5.0)
Anion gap: 11 (ref 5–15)
BUN: 17 mg/dL (ref 6–20)
CO2: 28 mmol/L (ref 22–32)
CREATININE: 2.66 mg/dL — AB (ref 0.61–1.24)
Calcium: 8.9 mg/dL (ref 8.9–10.3)
Chloride: 97 mmol/L — ABNORMAL LOW (ref 101–111)
GFR calc Af Amer: 23 mL/min — ABNORMAL LOW (ref >60)
GFR calc non Af Amer: 20 mL/min — ABNORMAL LOW (ref >60)
GLUCOSE: 89 mg/dL (ref 65–99)
PHOSPHORUS: 4.2 mg/dL (ref 2.5–4.6)
POTASSIUM: 4.3 mmol/L (ref 3.5–5.1)
SODIUM: 136 mmol/L (ref 135–145)

## 2014-11-01 LAB — CBC
HCT: 32 % — ABNORMAL LOW (ref 39.0–52.0)
HCT: 30.4 % — ABNORMAL LOW (ref 39.0–52.0)
HEMATOCRIT: 32.1 % — AB (ref 39.0–52.0)
Hemoglobin: 10.6 g/dL — ABNORMAL LOW (ref 13.0–17.0)
Hemoglobin: 10.5 g/dL — ABNORMAL LOW (ref 13.0–17.0)
Hemoglobin: 9.9 g/dL — ABNORMAL LOW (ref 13.0–17.0)
MCH: 36.8 pg — ABNORMAL HIGH (ref 26.0–34.0)
MCH: 36.7 pg — ABNORMAL HIGH (ref 26.0–34.0)
MCH: 36.7 pg — AB (ref 26.0–34.0)
MCHC: 33.1 g/dL (ref 30.0–36.0)
MCHC: 32.7 g/dL (ref 30.0–36.0)
MCHC: 32.6 g/dL (ref 30.0–36.0)
MCV: 111.1 fL — AB (ref 78.0–100.0)
MCV: 112.2 fL — ABNORMAL HIGH (ref 78.0–100.0)
MCV: 112.6 fL — ABNORMAL HIGH (ref 78.0–100.0)
PLATELETS: 179 10*3/uL (ref 150–400)
PLATELETS: 178 10*3/uL (ref 150–400)
PLATELETS: 197 10*3/uL (ref 150–400)
RBC: 2.88 MIL/uL — AB (ref 4.22–5.81)
RBC: 2.86 MIL/uL — ABNORMAL LOW (ref 4.22–5.81)
RBC: 2.7 MIL/uL — AB (ref 4.22–5.81)
RDW: 15 % (ref 11.5–15.5)
RDW: 15 % (ref 11.5–15.5)
RDW: 15.2 % (ref 11.5–15.5)
WBC: 11.1 10*3/uL — AB (ref 4.0–10.5)
WBC: 9.7 10*3/uL (ref 4.0–10.5)
WBC: 10.3 10*3/uL (ref 4.0–10.5)

## 2014-11-01 MED ORDER — SODIUM CHLORIDE 0.9 % IV BOLUS (SEPSIS)
250.0000 mL | Freq: Once | INTRAVENOUS | Status: AC
  Administered 2014-11-01: 250 mL via INTRAVENOUS

## 2014-11-01 MED ORDER — NEOMYCIN-POLYMYXIN-DEXAMETH 3.5-10000-0.1 OP OINT
TOPICAL_OINTMENT | Freq: Every day | OPHTHALMIC | Status: DC
  Administered 2014-11-01: 1 via OPHTHALMIC
  Administered 2014-11-02 – 2014-11-03 (×2): via OPHTHALMIC

## 2014-11-01 NOTE — Progress Notes (Addendum)
When I had a hand off report from the leaving RN, Nira Conn, i saw pt had a peripheral IV line on the left hand. And pt had an left arm restrictions due to the left upper arm AV fistula. IV team notified, got a new peripheral IV line on the right hand. I d/c the IV line on left forearm. Pt tolerated well, no complaints of discomfort. Will close monitor pt.
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
## 2014-11-01 NOTE — Progress Notes (Signed)
Utilization review completed. Elayjah Chaney, RN, BSN. 
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
## 2014-11-01 NOTE — Progress Notes (Signed)
Subjective:   Asleep awoken , No cos, "hold the fort down" HD yest pm   Objective Vital signs in last 24 hours: Filed Vitals:   10/31/14 2000 10/31/14 2025 10/31/14 2114 11/01/14 0555  BP: 102/60 98/62 128/68 95/53  Pulse: 125 128 107 66  Temp:  96.7 F (35.9 C) 97.9 F (36.6 C) 99.7 F (37.6 C)  TempSrc:  Oral Oral Oral  Resp:  22 20 28   Weight:  97.8 kg (215 lb 9.8 oz)  99.2 kg (218 lb 11.1 oz)  SpO2:  98% 93% 96%   Weight change:   Physical Exam: General: elderly WM Chronically ill (daughter in Rm) sl . More lethargic  / NAD  Heart: Irreg, Irreg with vr stable 60s' on my exam / 1/6 sem lsb ,no rub Lungs: sl decr bases otherwise CTA poor resp effort Abdomen: BS+, soft nontender ( did not do deep pal .with spleen laceration ) Extremities: bilat lower extrem edema (appearance of chronic edema bilat ) R lower extrem in brace Dialysis Access: L UA AVF pos bruit   Dialysis Orders: Center: [facility] . EDW 94.5kg HD Bath 2k,2ca Time 4hrs 64min Heparin 2800 bolus /1400 intermittent. Access AVF BFR 450 DFR af1.5 Mircera 44mcg q 4wks(11/14/14 next dose) Units IV/HD Venofer 50mg  wkly hd     Problem/Plan: 1. SP Fall with Splenic Laceration and R fem fracture- am hgb stable / Tx per admit team/ CCS and ortho/ Using  no heparin on HD /  2. Acute encephalopathy- Possibly med related With some dementia in 81 yo pt.lives at [facility] appears not quite as alert this am as yesterday.Daughter states he is able to tolerate liquid meals and wanting solids She is helping feed him.. 3. ESRD - Normal schedule MWF 4.3 k / no Hep hd with splenic laceration  4. Hypertension/volume - low bp in hosp and At op unit not making edw= Incr edw/(hoyer wts at op unit) 5. Anemia -hgb 9.7>10.6/ Weekly fe on hd and esa wkly on hd given yesterday on hd= 150 aranesp  6. History of myelodysplastic syndrome 7. Metabolic bone disease - No vit d listed at [facility] center / Renvela binder with  diet 8. Chronic A Fib - ho gi bld ( not anticoag candidate) 9. HO Polio- wheelchair ambulation baseline/Harrel Lemon lift wts as op kid center)  Ernest Haber, PA-C [place] [phone] 11/01/2014,9:07 AM  LOS: 1 day   Labs: Basic Metabolic Panel:  Recent Labs Lab 11/01/2014 2140 10/31/14 0456 11/01/14 0156  NA 135 132* 136  K 3.6 3.7 4.3  CL 91* 92* 97*  CO2 32 27 28  GLUCOSE 120* 104* 89  BUN 28* 32* 17  CREATININE 3.75* 3.92* 2.66*  CALCIUM 8.8* 8.5* 8.9  PHOS  --   --  4.2   Liver Function Tests:  Recent Labs Lab 11/04/2014 2140 10/31/14 0456 11/01/14 0156  AST 26 25  --   ALT 9* 9*  --   ALKPHOS 128* 124  --   BILITOT 0.9 1.2  --   PROT 7.9 7.9  --   ALBUMIN 2.9* 3.0* 2.8*   No results for input(s): LIPASE, AMYLASE in the last 168 hours.  Recent Labs Lab 10/31/14 0456  AMMONIA 42*   CBC:  Recent Labs Lab 10/27/2014 2140 10/31/14 0456 10/31/14 0814 10/31/14 1403 10/31/14 2305 11/01/14 0155  WBC 9.5 10.1 9.0 9.4 12.7* 11.1*  NEUTROABS 7.5  --   --   --   --   --  HGB 10.6* 10.5* 10.9* 9.5* 11.0* 10.6*  HCT 31.8* 31.9* 32.8* 28.2* 33.3* 32.0*  MCV 112.0* 111.1* 111.9* 112.4* 111.7* 111.1*  PLT 228 232 224 219 180 179   Cardiac Enzymes:  Recent Labs Lab 10/31/14 0456  CKTOTAL 28*  TROPONINI 0.13*   CBG:  Recent Labs Lab 10/31/14 1646  GLUCAP 88    Medications:   . acyclovir  800 mg Oral Once per day on Mon Wed Fri Sat  . anagrelide  0.5 mg Oral QODAY  . carvedilol  6.25 mg Oral BID WC  . cinacalcet  30 mg Oral Q supper  . [START ON 2014/12/05] darbepoetin (ARANESP) injection - DIALYSIS  100 mcg Intravenous Q Wed-HD  . docusate sodium  100 mg Oral BID  . febuxostat  40 mg Oral Daily  . [START ON 05-Dec-2014] ferric gluconate (FERRLECIT/NULECIT) IV  125 mg Intravenous Q Wed-HD  . finasteride  5 mg Oral Daily  . metoCLOPramide  5 mg Oral TID AC & HS  . multivitamin  1 tablet Oral QHS  . pantoprazole  40 mg Oral Daily   . prednisoLONE acetate  1 drop Left Eye BID  . sevelamer carbonate  1,600 mg Oral TID WC  . sodium chloride  3 mL Intravenous Q12H

## 2014-11-02 ENCOUNTER — Inpatient Hospital Stay (HOSPITAL_COMMUNITY): Payer: Medicare Other

## 2014-11-02 DIAGNOSIS — G9340 Encephalopathy, unspecified: Secondary | ICD-10-CM

## 2014-11-02 DIAGNOSIS — E440 Moderate protein-calorie malnutrition: Secondary | ICD-10-CM | POA: Insufficient documentation

## 2014-11-02 LAB — COMPREHENSIVE METABOLIC PANEL
ALK PHOS: 106 U/L (ref 38–126)
ALT: 10 U/L — AB (ref 17–63)
ANION GAP: 12 (ref 5–15)
AST: 26 U/L (ref 15–41)
Albumin: 2.8 g/dL — ABNORMAL LOW (ref 3.5–5.0)
BILIRUBIN TOTAL: 1.3 mg/dL — AB (ref 0.3–1.2)
BUN: 26 mg/dL — ABNORMAL HIGH (ref 6–20)
CALCIUM: 9 mg/dL (ref 8.9–10.3)
CO2: 28 mmol/L (ref 22–32)
CREATININE: 3.8 mg/dL — AB (ref 0.61–1.24)
Chloride: 94 mmol/L — ABNORMAL LOW (ref 101–111)
GFR calc non Af Amer: 13 mL/min — ABNORMAL LOW (ref >60)
GFR, EST AFRICAN AMERICAN: 15 mL/min — AB (ref >60)
GLUCOSE: 90 mg/dL (ref 65–99)
Potassium: 4.4 mmol/L (ref 3.5–5.1)
SODIUM: 134 mmol/L — AB (ref 135–145)
TOTAL PROTEIN: 6.9 g/dL (ref 6.5–8.1)

## 2014-11-02 LAB — CBC
HEMATOCRIT: 31.6 % — AB (ref 39.0–52.0)
HEMOGLOBIN: 10.5 g/dL — AB (ref 13.0–17.0)
MCH: 37 pg — AB (ref 26.0–34.0)
MCHC: 33.2 g/dL (ref 30.0–36.0)
MCV: 111.3 fL — ABNORMAL HIGH (ref 78.0–100.0)
Platelets: 229 10*3/uL (ref 150–400)
RBC: 2.84 MIL/uL — AB (ref 4.22–5.81)
RDW: 15.2 % (ref 11.5–15.5)
WBC: 10.7 10*3/uL — ABNORMAL HIGH (ref 4.0–10.5)

## 2014-11-02 LAB — PHOSPHORUS: Phosphorus: 5.5 mg/dL — ABNORMAL HIGH (ref 2.5–4.6)

## 2014-11-02 MED ORDER — NEPRO/CARBSTEADY PO LIQD
237.0000 mL | Freq: Two times a day (BID) | ORAL | Status: DC
  Administered 2014-11-02: 237 mL via ORAL
  Filled 2014-11-02 (×12): qty 237

## 2014-11-02 MED ORDER — BOOST / RESOURCE BREEZE PO LIQD
1.0000 | ORAL | Status: DC
  Administered 2014-11-03: 1 via ORAL

## 2014-11-02 MED ORDER — NALOXONE HCL 0.4 MG/ML IJ SOLN
0.4000 mg | INTRAMUSCULAR | Status: AC
  Administered 2014-11-02: 0.4 mg via INTRAVENOUS
  Filled 2014-11-02: qty 1

## 2014-11-02 MED ORDER — DARBEPOETIN ALFA 150 MCG/0.3ML IJ SOSY
150.0000 ug | PREFILLED_SYRINGE | INTRAMUSCULAR | Status: DC
  Administered 2014-11-02: 150 ug via INTRAVENOUS
  Filled 2014-11-02: qty 0.3

## 2014-11-02 MED ORDER — DARBEPOETIN ALFA 150 MCG/0.3ML IJ SOSY
PREFILLED_SYRINGE | INTRAMUSCULAR | Status: AC
  Filled 2014-11-02: qty 0.3

## 2014-11-02 MED ORDER — NALOXONE HCL 0.4 MG/ML IJ SOLN
INTRAMUSCULAR | Status: AC
  Administered 2014-11-02: 0.4 mg via INTRAVENOUS
  Filled 2014-11-02: qty 1

## 2014-11-02 NOTE — Procedures (Signed)
I was present at this session.  I have reviewed the session itself and made appropriate changes.  bp low to start, vol xs.  Resp status marginal, ? Sedation and ms weakness  [name] L 8/19/20168:29 AM

## 2014-11-02 NOTE — Progress Notes (Signed)
Patient returned from dialysis at this time with non-rebreather mask on. Per HD nurse, patient was having difficulty breathing in which MD and respiratory therapist notified. Patient is stable at this time with sats at 100%.

## 2014-11-02 NOTE — Progress Notes (Signed)
RT to dialysis to assess pt. rr 28 and labored bbs clear pt on nrb mask sats 100%.

## 2014-11-02 NOTE — Progress Notes (Signed)
Initial Nutrition Assessment  DOCUMENTATION CODES:   Non-severe (moderate) malnutrition in context of chronic illness  INTERVENTION:  Provide Nepro Shake po BID, each supplement provides 425 kcal and 19 grams protein Provide Resource breeze once daily, each supplement provides 250 kcal and 9 grams of protein Provide Magic Cup once daily, each cup provides 290 kcal and 9 grams of protein    NUTRITION DIAGNOSIS:   Inadequate oral intake related to lethargy/confusion, chronic illness, poor appetite as evidenced by meal completion < 50%, severe depletion of muscle mass, moderate depletion of body fat.   GOAL:   Patient will meet greater than or equal to 90% of their needs   MONITOR:   PO intake, Supplement acceptance, Weight trends, Labs, I & O's  REASON FOR ASSESSMENT:   Low Braden    ASSESSMENT:   81 y.o. male with history of ESRD on hemodialysis on Monday Wednesday and Friday, chronic atrial fibrillation not on anticoagulation secondary to AVMs, history of GI bleed secondary to AVMs, myelodysplastic syndrome on anagrelide, chronic anemia was brought to the ER to patient had a fall at his living facility. Pt with right femur fracture and possible splenic laceratuib.  Pt very lethargic at time of visit; re-breather mask on. Per nursing notes, pt has been eating 25% of meals. Pt's grandchildren at bedside report that pt lost weight a while back but, has been maintaining weight recently. Pt did not eat breakfast due to HD and he only ate a few bites for lunch. Grandchildren state that patient was having difficulty breathing and was coughing while trying to eat. Pt has severe muscle wasting of clavicles and moderate fat wasting of arms and face- meets criteria for moderate malnutrition.   Labs reviewed.   Diet Order:  Diet renal with fluid restriction Room service appropriate?: Yes; Fluid consistency:: Thin  Skin:  Reviewed, no issues  Last BM:  8/19  Height:   Ht Readings  from Last 1 Encounters:  11/02/14 6' (1.829 m)    Weight:   Wt Readings from Last 1 Encounters:  11/02/14 222 lb 14.2 oz (101.1 kg)    Ideal Body Weight:  80.9 kg  BMI:  Body mass index is 30.22 kg/(m^2).  Estimated Nutritional Needs:   Kcal:  7782-4235  Protein:  90-100 grams  Fluid:  per MD  EDUCATION NEEDS:   No education needs identified at this time  [place], LDN Inpatient Clinical Dietitian Pager: [phone] After Hours Pager: [phone]

## 2014-11-02 NOTE — Progress Notes (Signed)
[facility] PROGRESS NOTE  Brent Mcneil:[id number] DOB: 08/31/1923 DOA: 11/06/2014 PCP: Bonnita Nasuti, MD Interim summary: Brent Mcneil is a 81 y.o. male with history of ESRD on hemodialysis on Monday Wednesday and Friday, chronic atrial fibrillation not on anticoagulation secondary to AVMs, history of GI bleed secondary to AVMs, myelodysplastic syndrome on anagrelide, chronic anemia was brought to the ER to patient had a fall at his living facility, found to have femur fracture and lacerated spleen. Surgery and orthopedics consulted .  Assessment/Plan: 1. Grade 3 splenic laceration: - bed rest for 72 hours since admission, hemoglobin stable. Appreciate surgery recommendations.   2. Right femur fracture: Non opertive in bledsoe Brace per orthopedics.  Wheel chair bound.   3. Mild anemia: Stable.   4. ESRD on HD Further management as per orthopedics.    5. Hypotension: resolved.   6. Chronic atrial fibrillation: rate controlled.   7.hypoxia after HD today:  CXR ordered.  Try to wean her off oxygen .   Code Status:DNR Family Communication: FAMILY AT bedside Disposition Plan: back to snf when stable..   Consultants:  Surgery  Orthopedics.   Procedures:  none  Antibiotics:  none  HPI/Subjective: Sleeping comfortably, no new complaints. BM this am.   Objective: Filed Vitals:   11/02/14 1230  BP: 96/63  Pulse: 109  Temp: 97.9 F (36.6 C)  Resp: 29    Intake/Output Summary (Last 24 hours) at 11/02/14 1819 Last data filed at 11/02/14 1230  Gross per 24 hour  Intake    125 ml  Output   2000 ml  Net  -1875 ml   Filed Weights   10/31/14 2025 11/01/14 0555 11/02/14 0355  Weight: 97.8 kg (215 lb 9.8 oz) 99.2 kg (218 lb 11.1 oz) 101.1 kg (222 lb 14.2 oz)    Exam:   General:  Alert afebrile comfortable  Cardiovascular: s1s2  Respiratory: ctab  Abdomen: soft mild tender ness int he right upper quadrant, bowel sounds  heard  Musculoskeletal: trace edema.   Data Reviewed: Basic Metabolic Panel:  Recent Labs Lab 11/08/2014 2140 10/31/14 0456 11/01/14 0156 11/02/14 0911  NA 135 132* 136 134*  K 3.6 3.7 4.3 4.4  CL 91* 92* 97* 94*  CO2 32 27 28 28   GLUCOSE 120* 104* 89 90  BUN 28* 32* 17 26*  CREATININE 3.75* 3.92* 2.66* 3.80*  CALCIUM 8.8* 8.5* 8.9 9.0  PHOS  --   --  4.2 5.5*   Liver Function Tests:  Recent Labs Lab 10/17/2014 2140 10/31/14 0456 11/01/14 0156 11/02/14 0911  AST 26 25  --  26  ALT 9* 9*  --  10*  ALKPHOS 128* 124  --  106  BILITOT 0.9 1.2  --  1.3*  PROT 7.9 7.9  --  6.9  ALBUMIN 2.9* 3.0* 2.8* 2.8*   No results for input(s): LIPASE, AMYLASE in the last 168 hours.  Recent Labs Lab 10/31/14 0456  AMMONIA 42*   CBC:  Recent Labs Lab 10/20/2014 2140  10/31/14 2305 11/01/14 0155 11/01/14 0854 11/01/14 1439 11/02/14 0911  WBC 9.5  < > 12.7* 11.1* 9.7 10.3 10.7*  NEUTROABS 7.5  --   --   --   --   --   --   HGB 10.6*  < > 11.0* 10.6* 10.5* 9.9* 10.5*  HCT 31.8*  < > 33.3* 32.0* 32.1* 30.4* 31.6*  MCV 112.0*  < > 111.7* 111.1* 112.2* 112.6* 111.3*  PLT 228  < >  180 179 178 197 229  < > = values in this interval not displayed. Cardiac Enzymes:  Recent Labs Lab 10/31/14 0456  CKTOTAL 28*  TROPONINI 0.13*   BNP (last 3 results) No results for input(s): BNP in the last 8760 hours.  ProBNP (last 3 results) No results for input(s): PROBNP in the last 8760 hours.  CBG:  Recent Labs Lab 10/31/14 1646  GLUCAP 88    No results found for this or any previous visit (from the past 240 hour(s)).   Studies: No results found.  Scheduled Meds: . acyclovir  800 mg Oral Once per day on Mon Wed Fri Sat  . anagrelide  0.5 mg Oral QODAY  . cinacalcet  30 mg Oral Q supper  . Darbepoetin Alfa      . darbepoetin (ARANESP) injection - DIALYSIS  150 mcg Intravenous Q Fri-HD  . docusate sodium  100 mg Oral BID  . febuxostat  40 mg Oral Daily  . [START ON  11/03/2014] feeding supplement  1 Container Oral Q24H  . feeding supplement (NEPRO CARB STEADY)  237 mL Oral BID PC  . [START ON Nov 08, 2014] ferric gluconate (FERRLECIT/NULECIT) IV  125 mg Intravenous Q Wed-HD  . finasteride  5 mg Oral Daily  . metoCLOPramide  5 mg Oral TID AC & HS  . multivitamin  1 tablet Oral QHS  . neomycin-polymyxin b-dexamethasone   Left Eye QHS  . pantoprazole  40 mg Oral Daily  . sevelamer carbonate  1,600 mg Oral TID WC  . sodium chloride  3 mL Intravenous Q12H   Continuous Infusions:   Principal Problem:   Splenic laceration Active Problems:   MDS (myelodysplastic syndrome)   Chronic atrial fibrillation   ESRD on dialysis   Chronic anemia   Femur fracture, right   Acute encephalopathy   Malnutrition of moderate degree    Time spent: 25 minutes/     [place]  [facility] Pager [phone]  If 7PM-7AM, please contact night-coverage at [email or web address], password [facility] 11/02/2014, 6:19 PM  LOS: 2 days

## 2014-11-02 NOTE — Progress Notes (Signed)
Patient ID: Brent Mcneil, male   DOB: Dec 15, 1923, 81 y.o.   MRN: [id number]   LOS: 2 days   Subjective: Denies abd pain.   Objective: Vital signs in last 24 hours: Temp:  [97.8 F (36.6 C)-98.1 F (36.7 C)] 97.8 F (36.6 C) (08/19 0721) Pulse Rate:  [29-116] 115 (08/19 0830) Resp:  [19-28] 27 (08/19 0830) BP: (89-107)/(45-67) 107/67 mmHg (08/19 0830) SpO2:  [94 %-100 %] 100 % (08/19 0721) Weight:  [101.1 kg (222 lb 14.2 oz)] 101.1 kg (222 lb 14.2 oz) (08/19 0355) Last BM Date: 11/01/14   Laboratory  CBC  Recent Labs  11/01/14 1439 11/02/14 0911  WBC 10.3 10.7*  HGB 9.9* 10.5*  HCT 30.4* 31.6*  PLT 197 229    Physical Exam General appearance: no distress Resp: clear to auscultation bilaterally Cardio: regular rate and rhythm GI: normal findings: bowel sounds normal and soft, non-tender   Assessment/Plan: Fall Grade 3 splenic lac -- Bedrest D3/3, serial hgb's. Stable thus far. Right femur fx -- Non-operative In Bledsoe brace per Dr. Percell Miller ABL anemia -- Stable Multiple medical problems -- per primary team   Lisette Abu, PA-C Pager: [phone] General Trauma PA Pager: [phone]  11/02/2014

## 2014-11-02 NOTE — Progress Notes (Signed)
Subjective: Interval History: has no complaint.  Objective: Vital signs in last 24 hours: Temp:  [97.8 F (36.6 C)-98.1 F (36.7 C)] 97.9 F (36.6 C) (08/19 0355) Pulse Rate:  [88-99] 95 (08/19 0355) Resp:  [19-20] 19 (08/19 0355) BP: (89-95)/(45-53) 95/53 mmHg (08/19 0355) SpO2:  [94 %-96 %] 96 % (08/19 0355) Weight:  [101.1 kg (222 lb 14.2 oz)] 101.1 kg (222 lb 14.2 oz) (08/19 0355) Weight change: 3.8 kg (8 lb 6 oz)  Intake/Output from previous day: 08/18 0701 - 08/19 0700 In: 805 [P.O.:805] Out: -  Intake/Output this shift:    General appearance: mild distress, moderately obese, pale, slowed mentation and ^ RR, lethargic , slow mentation Resp: diminished breath sounds bilaterally and rales bibasilar Cardio: S1, S2 normal and systolic murmur: holosystolic 2/6, blowing at apex GI: pos bs, soft, obese, liver down 6 cm Extremities: edema 3-4+ presacral,  and R leg angulated out, shortened.    Ostomy LMID abdm  Lab Results:  Recent Labs  11/01/14 0854 11/01/14 1439  WBC 9.7 10.3  HGB 10.5* 9.9*  HCT 32.1* 30.4*  PLT 178 197   BMET:  Recent Labs  10/31/14 0456 11/01/14 0156  NA 132* 136  K 3.7 4.3  CL 92* 97*  CO2 27 28  GLUCOSE 104* 89  BUN 32* 17  CREATININE 3.92* 2.66*  CALCIUM 8.5* 8.9   No results for input(s): PTH in the last 72 hours. Iron Studies: No results for input(s): IRON, TIBC, TRANSFERRIN, FERRITIN in the last 72 hours.  Studies/Results: No results found.  I have reviewed the patient's current medications.  Assessment/Plan: 1 ESRD vol xs but low bp 2 Resp ms weakness, ? Sedation,  ? Vol 3 Anemia ESA 4 Femur fx 5 Hx polio 6 Enceph  ??? Cause 7 Myelodysplasia P HD, lower vol, follow resp status, stop carvedilol    LOS: 2 days   Precious Segall L 11/02/2014,8:31 AM

## 2014-11-02 NOTE — Progress Notes (Signed)
Pt  Kept pulling off  mask 02 on per nasal cannula

## 2014-11-03 LAB — COMPREHENSIVE METABOLIC PANEL
ALT: 11 U/L — ABNORMAL LOW (ref 17–63)
ANION GAP: 11 (ref 5–15)
AST: 31 U/L (ref 15–41)
Albumin: 2.7 g/dL — ABNORMAL LOW (ref 3.5–5.0)
Alkaline Phosphatase: 107 U/L (ref 38–126)
BUN: 17 mg/dL (ref 6–20)
CHLORIDE: 95 mmol/L — AB (ref 101–111)
CO2: 28 mmol/L (ref 22–32)
Calcium: 8.9 mg/dL (ref 8.9–10.3)
Creatinine, Ser: 2.59 mg/dL — ABNORMAL HIGH (ref 0.61–1.24)
GFR, EST AFRICAN AMERICAN: 24 mL/min — AB (ref >60)
GFR, EST NON AFRICAN AMERICAN: 20 mL/min — AB (ref >60)
Glucose, Bld: 92 mg/dL (ref 65–99)
POTASSIUM: 4.4 mmol/L (ref 3.5–5.1)
SODIUM: 134 mmol/L — AB (ref 135–145)
Total Bilirubin: 1 mg/dL (ref 0.3–1.2)
Total Protein: 7.1 g/dL (ref 6.5–8.1)

## 2014-11-03 LAB — PHOSPHORUS: PHOSPHORUS: 4.5 mg/dL (ref 2.5–4.6)

## 2014-11-03 LAB — CBC
HCT: 31.1 % — ABNORMAL LOW (ref 39.0–52.0)
Hemoglobin: 9.9 g/dL — ABNORMAL LOW (ref 13.0–17.0)
MCH: 36 pg — AB (ref 26.0–34.0)
MCHC: 31.8 g/dL (ref 30.0–36.0)
MCV: 113.1 fL — AB (ref 78.0–100.0)
PLATELETS: 188 10*3/uL (ref 150–400)
RBC: 2.75 MIL/uL — AB (ref 4.22–5.81)
RDW: 15.2 % (ref 11.5–15.5)
WBC: 10.8 10*3/uL — AB (ref 4.0–10.5)

## 2014-11-03 MED ORDER — WHITE PETROLATUM GEL
Status: AC
  Administered 2014-11-03: 19:00:00
  Filled 2014-11-03: qty 1

## 2014-11-03 NOTE — Progress Notes (Signed)
Subjective: Interval History: has complaints leg sore.  Objective: Vital signs in last 24 hours: Temp:  [97.9 F (36.6 C)-99.1 F (37.3 C)] 99.1 F (37.3 C) (08/20 0505) Pulse Rate:  [83-118] 100 (08/20 0505) Resp:  [20-29] 20 (08/20 0505) BP: (89-103)/(43-66) 102/46 mmHg (08/20 0505) SpO2:  [99 %-100 %] 99 % (08/20 0505) Weight:  [97 kg (213 lb 13.5 oz)] 97 kg (213 lb 13.5 oz) (08/20 0505) Weight change: -4.1 kg (-9 lb 0.6 oz)  Intake/Output from previous day: 08/19 0701 - 08/20 0700 In: -  Out: 2000  Intake/Output this shift:    General appearance: lethargic but will answer some questions,  audible rhonchi Resp: diminished breath sounds bilaterally and rhonchi bilaterally Cardio: regular rate and rhythm and systolic murmur: holosystolic 2/6, blowing at apex GI: pos bs, soft,  Extremities: LUA AVF, B&T.  R leg shortened and turned out,  3+ edema Colostomy L lower abdm Lab Results:  Recent Labs  11/02/14 0911 11/03/14 0358  WBC 10.7* 10.8*  HGB 10.5* 9.9*  HCT 31.6* 31.1*  PLT 229 188   BMET:  Recent Labs  11/02/14 0911 11/03/14 0358  NA 134* 134*  K 4.4 4.4  CL 94* 95*  CO2 28 28  GLUCOSE 90 92  BUN 26* 17  CREATININE 3.80* 2.59*  CALCIUM 9.0 8.9   No results for input(s): PTH in the last 72 hours. Iron Studies: No results for input(s): IRON, TIBC, TRANSFERRIN, FERRITIN in the last 72 hours.  Studies/Results: Dg Chest Port 1 View  11/02/2014   CLINICAL DATA:  Hypoxia, acute onset. Labored breathing. Initial encounter.  EXAM: PORTABLE CHEST - 1 VIEW  COMPARISON:  Chest radiograph performed 10/15/2013, and CT of the chest performed 10/19/2014  FINDINGS: The lungs are well-aerated. Vascular congestion is noted. Mild bilateral atelectasis is seen. There is no evidence of pleural effusion or pneumothorax.  The cardiomediastinal silhouette is borderline normal in size. No acute osseous abnormalities are seen.  IMPRESSION: Vascular congestion noted.  Mild  bilateral atelectasis seen.   Electronically Signed   By: Garald Balding M.D.   On: 11/02/2014 23:33    I have reviewed the patient's current medications.  Assessment/Plan: 1 ESRD vol xs but low bps.  Must be able to sit in chair 6 h prior to d/c.  Not sure this is going to work.   2 Leg fx per ortho, may limit outpatient tx 3 Vol xs 4 Hx GI avms 5 Hx Polio 6 Decreased MS  This is an issue  7 Myelodysplasia 8 Splenic laceration 9 Colostomy P mobilize, to HD in chair on Mon, MS per primary    LOS: 3 days   Ozzie Remmers L 11/03/2014,9:17 AM

## 2014-11-03 NOTE — Progress Notes (Signed)
Called to x-ray. Barium swallow scheduled for the morning. Paged Dr. Karleen Hampshire and verified that the pt is to remain NPO. No fluids ordered due to chest congestion and he is a hemodialysis patient. Informed the family at bedside. Family concerned that the patient will not receive nutrition throughout the night. Educated the family about the risk of aspiration. Gave that family wet toothettes and vaseline for mouth/lip dryness. Will continue to monitor.

## 2014-11-03 NOTE — Evaluation (Signed)
Speech Language Pathology Bedside Swallow Evaluation Patient Details Name: Brent Mcneil MRN: [id number] DOB: Jul 22, 1923 Today's Date: 11/03/2014 Time: 3716-9678 SLP Time Calculation (min) (ACUTE ONLY): 40 min  Problem List:  Patient Active Problem List   Diagnosis Date Noted  . Malnutrition of moderate degree 11/02/2014  . Splenic laceration 10/31/2014  . Chronic anemia 10/31/2014  . Femur fracture, right 10/31/2014  . Acute encephalopathy 10/31/2014  . Absolute anemia   . Bleeding gastrointestinal   . GI bleeding 04/01/2014  . Chronic atrial fibrillation 04/01/2014  . ESRD on dialysis 04/01/2014  . Colostomy in place 04/01/2014  . GI bleed 04/01/2014  . Atrial fibrillation with RVR 02/09/2013  . SIRS (systemic inflammatory response syndrome) 02/09/2013  . ESRD (end stage renal disease) 02/09/2013  . History of arteriovenous malformation (AVM) 02/09/2013  . MDS (myelodysplastic syndrome) 02/09/2013  . End stage renal disease 07/26/2012   Past Medical History:  Past Medical History  Diagnosis Date  . Chronic kidney disease     HD- M-W-F  . End stage renal disease   . [place]ic encephalopathy     Metabolic  . Anemia   . Gastrointestinal bleed   . Ulcer     Peptic and gastric   . Ulcerative esophagitis   . Myeloproliferative disorder   . Macular degeneration 1990s    bilateral  . History of thrombocytosis   . Myocardial infarction 1996  . Polio age 62  . Pneumonia   . Stroke     approx 6-7 years ago, affected speech for a short time  . Atrial flutter   . Atrial fibrillation 08/2012    treated with Carvedilol, Echo done 09/22/12  . Laceration of spleen 11/11/2014  . Fracture closed, femur, shaft 10/2014    RT    Past Surgical History:  Past Surgical History  Procedure Laterality Date  . Colon resection  09-2006  . Tonsillectomy  1958  . Orthopedic surgeries  as a child    due to polio  . Cataract surgery Right 2004  . Colon surgery      with Colostomy  .  Av fistula placement Left 08/18/2012    Procedure: ARTERIOVENOUS (AV) FISTULA CREATION;  Surgeon: Serafina Mitchell, MD;  Location: [place];  Service: Vascular;  Laterality: Left;  Marland Kitchen Eye surgery Right     cataract  . Cardiac catheterization  1996  . Av fistula placement Left 10/13/2012    Procedure: ARTERIOVENOUS (AV) FISTULA CREATION;  Surgeon: Serafina Mitchell, MD;  Location: [place];  Service: Vascular;  Laterality: Left;   HPI:  Brent Mcneil is a 81 y.o. male with history of ESRD on hemodialysis on Monday Wednesday and Friday, chronic atrial fibrillation not on anticoagulation secondary to AVMs, history of GI bleed secondary to AVMs, myelodysplastic syndrome on anagrelide, chronic anemia was brought to the ER to patient had a fall at his living facility. As per the patient's daughter patient slid out of his motorized wheelchair and got tangled in between the wheelchair and the cabinet. Patient did not lose consciousness. On exam in the ER patient has some contusion on his chest and CT abdomen shows possible laceration of the spleen and also fracture of the right femur distal supracondylar area. Per patient's daughter patient has become increasingly confused over the last 2-3 days. Per daughter patient's pain medications were discontinued last month but since patient is complaining of increasing pain in his low back since Friday patient was placed back on oxycodone. MRI of the  brain did not show anything acute. Chest x ray showed Vascular congestion and mild bilateral atelectasis   Assessment / Plan / Recommendation Clinical Impression  Pt presenting with signs and symptoms of suspected oropharyngeal dypshagia. This felt to be secondary to poor mentation and decreased alertness. Noted delayed transit of puree bolus, poor independent initiation for mastication and bolus propulsion, and suspect delay in swallow initiation. Pt unable to coordinate suck swallow from straw and displays delay in swallow  initiation with thin liquids. Nectar thick trials also evidenced signs and symtpoms of decreased airway protection with delayed congested coughing response.  Pt with multiple risk factors for aspiration including cognitive decline exacerbation, decreased respiratory support, and history of CVA. Recommend NPO except meds: crushed with puree only until MBS can confirm safest least restrictive diet. Will attempt as soon as possible.      SLP Assessment       Follow Up Recommendations       Frequency and Duration min 2x/week      Pertinent Vitals/Pain     SLP Goals     SLP Evaluation Prior Functioning      Cognition  Orientation Level: Other (comment) (patient wakes up briefly and falls back to sleep.)    Comprehension       Expression     Oral / Motor Oral Motor/Sensory Function Overall Oral Motor/Sensory Function: Other (comment) (Pt unable to follow commands beyond opening mouth )   GO    Arvil Chaco MA, [place] Pathologist    Levi Aland 11/03/2014, 11:53 AM

## 2014-11-03 NOTE — Progress Notes (Signed)
  Subjective: Sleepy but able to arouse Denies pain Tolerating po  Objective: Vital signs in last 24 hours: Temp:  [97.9 F (36.6 C)-99.1 F (37.3 C)] 99.1 F (37.3 C) (08/20 0505) Pulse Rate:  [83-114] 100 (08/20 0505) Resp:  [20-29] 20 (08/20 0505) BP: (89-103)/(43-63) 102/46 mmHg (08/20 0505) SpO2:  [99 %-100 %] 99 % (08/20 0505) Weight:  [97 kg (213 lb 13.5 oz)] 97 kg (213 lb 13.5 oz) (08/20 0505) Last BM Date: 11/02/14  Intake/Output from previous day: 08/19 0701 - 08/20 0700 In: -  Out: 2000  Intake/Output this shift:    Abdomen soft, non tender/non distended  Lab Results:   Recent Labs  11/02/14 0911 11/03/14 0358  WBC 10.7* 10.8*  HGB 10.5* 9.9*  HCT 31.6* 31.1*  PLT 229 188   BMET  Recent Labs  11/02/14 0911 11/03/14 0358  NA 134* 134*  K 4.4 4.4  CL 94* 95*  CO2 28 28  GLUCOSE 90 92  BUN 26* 17  CREATININE 3.80* 2.59*  CALCIUM 9.0 8.9   PT/INR No results for input(s): LABPROT, INR in the last 72 hours. ABG No results for input(s): PHART, HCO3 in the last 72 hours.  Invalid input(s): PCO2, PO2  Studies/Results: Dg Chest Port 1 View  11/02/2014   CLINICAL DATA:  Hypoxia, acute onset. Labored breathing. Initial encounter.  EXAM: PORTABLE CHEST - 1 VIEW  COMPARISON:  Chest radiograph performed 10/15/2013, and CT of the chest performed 10/28/2014  FINDINGS: The lungs are well-aerated. Vascular congestion is noted. Mild bilateral atelectasis is seen. There is no evidence of pleural effusion or pneumothorax.  The cardiomediastinal silhouette is borderline normal in size. No acute osseous abnormalities are seen.  IMPRESSION: Vascular congestion noted.  Mild bilateral atelectasis seen.   Electronically Signed   By: Garald Balding M.D.   On: 11/02/2014 23:33    Anti-infectives: Anti-infectives    Start     Dose/Rate Route Frequency Ordered Stop   10/31/14 2200  acyclovir (ZOVIRAX) tablet 800 mg     800 mg Oral Once per day on Mon Wed Fri Sat  10/31/14 0359        Assessment/Plan:  Fall with splenic laceration and rib fractures  hgb down slight.  Remains stable hemodynamically.  Will repeat in the morning Ok to start getting up from trauma standpoint.    LOS: 3 days    Kaitlynne Wenz A 11/03/2014

## 2014-11-03 NOTE — Progress Notes (Signed)
[facility] PROGRESS NOTE  Brent Mcneil EXN:[id number] DOB: [birth year] DOA: 11/06/2014 PCP: Bonnita Nasuti, MD Interim summary: Brent Mcneil is a 81 y.o. male with history of ESRD on hemodialysis on Monday Wednesday and Friday, chronic atrial fibrillation not on anticoagulation secondary to AVMs, history of GI bleed secondary to AVMs, myelodysplastic syndrome on anagrelide, chronic anemia was brought to the ER to patient had a fall at his living facility, found to have femur fracture and lacerated spleen. Surgery and orthopedics consulted .  Assessment/Plan: 1. Grade 3 splenic laceration: - bed rest for 72 hours since admission, hemoglobin stable. Appreciate surgery recommendations.   2. Right femur fracture: Non opertive in bledsoe Brace per orthopedics.  Wheel chair bound.   3. Mild anemia: macrocytic. Will get anemia panel.  Stable.   4. ESRD on HD Further management as per orthopedics.    5. Hypotension: resolved.   6. Chronic atrial fibrillation: rate controlled.   7.hypoxia after HD 8/19:  CXR ordered, showed congestion.   will Try to wean him off oxygen .   Code Status:DNR Family Communication: FAMILY AT bedside Disposition Plan: back to snf when stable..   Consultants:  Surgery  Orthopedics.   Procedures:  none  Antibiotics:  none  HPI/Subjective: Sleeping comfortably, no new complaints. Coughing while drinking.  NPO and speech eval followed by MBS ordered.   Objective: Filed Vitals:   11/03/14 1446  BP: 98/47  Pulse: 102  Temp: 97.9 F (36.6 C)  Resp: 20   No intake or output data in the 24 hours ending 11/03/14 1845 Filed Weights   11/01/14 0555 11/02/14 0355 11/03/14 0505  Weight: 99.2 kg (218 lb 11.1 oz) 101.1 kg (222 lb 14.2 oz) 97 kg (213 lb 13.5 oz)    Exam:   General:  Alert afebrile comfortable  Cardiovascular: s1s2  Respiratory: ctab  Abdomen: soft mild tender ness int he right upper quadrant, bowel sounds  heard  Musculoskeletal: trace edema.   Data Reviewed: Basic Metabolic Panel:  Recent Labs Lab 11/01/2014 2140 10/31/14 0456 11/01/14 0156 11/02/14 0911 11/03/14 0358  NA 135 132* 136 134* 134*  K 3.6 3.7 4.3 4.4 4.4  CL 91* 92* 97* 94* 95*  CO2 32 27 28 28 28   GLUCOSE 120* 104* 89 90 92  BUN 28* 32* 17 26* 17  CREATININE 3.75* 3.92* 2.66* 3.80* 2.59*  CALCIUM 8.8* 8.5* 8.9 9.0 8.9  PHOS  --   --  4.2 5.5* 4.5   Liver Function Tests:  Recent Labs Lab 11/11/2014 2140 10/31/14 0456 11/01/14 0156 11/02/14 0911 11/03/14 0358  AST 26 25  --  26 31  ALT 9* 9*  --  10* 11*  ALKPHOS 128* 124  --  106 107  BILITOT 0.9 1.2  --  1.3* 1.0  PROT 7.9 7.9  --  6.9 7.1  ALBUMIN 2.9* 3.0* 2.8* 2.8* 2.7*   No results for input(s): LIPASE, AMYLASE in the last 168 hours.  Recent Labs Lab 10/31/14 0456  AMMONIA 42*   CBC:  Recent Labs Lab 11/12/2014 2140  11/01/14 0155 11/01/14 0854 11/01/14 1439 11/02/14 0911 11/03/14 0358  WBC 9.5  < > 11.1* 9.7 10.3 10.7* 10.8*  NEUTROABS 7.5  --   --   --   --   --   --   HGB 10.6*  < > 10.6* 10.5* 9.9* 10.5* 9.9*  HCT 31.8*  < > 32.0* 32.1* 30.4* 31.6* 31.1*  MCV 112.0*  < > 111.1*  112.2* 112.6* 111.3* 113.1*  PLT 228  < > 179 178 197 229 188  < > = values in this interval not displayed. Cardiac Enzymes:  Recent Labs Lab 10/31/14 0456  CKTOTAL 28*  TROPONINI 0.13*   BNP (last 3 results) No results for input(s): BNP in the last 8760 hours.  ProBNP (last 3 results) No results for input(s): PROBNP in the last 8760 hours.  CBG:  Recent Labs Lab 10/31/14 1646  GLUCAP 88    No results found for this or any previous visit (from the past 240 hour(s)).   Studies: Dg Chest Port 1 View  11/02/2014   CLINICAL DATA:  Hypoxia, acute onset. Labored breathing. Initial encounter.  EXAM: PORTABLE CHEST - 1 VIEW  COMPARISON:  Chest radiograph performed 10/15/2013, and CT of the chest performed 10/20/2014  FINDINGS: The lungs are  well-aerated. Vascular congestion is noted. Mild bilateral atelectasis is seen. There is no evidence of pleural effusion or pneumothorax.  The cardiomediastinal silhouette is borderline normal in size. No acute osseous abnormalities are seen.  IMPRESSION: Vascular congestion noted.  Mild bilateral atelectasis seen.   Electronically Signed   By: Garald Balding M.D.   On: 11/02/2014 23:33    Scheduled Meds: . acyclovir  800 mg Oral Once per day on Mon Wed Fri Sat  . anagrelide  0.5 mg Oral QODAY  . cinacalcet  30 mg Oral Q supper  . darbepoetin (ARANESP) injection - DIALYSIS  150 mcg Intravenous Q Fri-HD  . docusate sodium  100 mg Oral BID  . febuxostat  40 mg Oral Daily  . feeding supplement  1 Container Oral Q24H  . feeding supplement (NEPRO CARB STEADY)  237 mL Oral BID PC  . [START ON 2014/11/20] ferric gluconate (FERRLECIT/NULECIT) IV  125 mg Intravenous Q Wed-HD  . finasteride  5 mg Oral Daily  . metoCLOPramide  5 mg Oral TID AC & HS  . multivitamin  1 tablet Oral QHS  . neomycin-polymyxin b-dexamethasone   Left Eye QHS  . pantoprazole  40 mg Oral Daily  . sevelamer carbonate  1,600 mg Oral TID WC  . sodium chloride  3 mL Intravenous Q12H   Continuous Infusions:   Principal Problem:   Splenic laceration Active Problems:   MDS (myelodysplastic syndrome)   Chronic atrial fibrillation   ESRD on dialysis   Chronic anemia   Femur fracture, right   Acute encephalopathy   Malnutrition of moderate degree    Time spent: 25 minutes/     [place]  [facility] Pager [phone]  If 7PM-7AM, please contact night-coverage at [email or web address], password [facility] 11/03/2014, 6:45 PM  LOS: 3 days

## 2014-11-04 ENCOUNTER — Inpatient Hospital Stay (HOSPITAL_COMMUNITY): Payer: Medicare Other

## 2014-11-04 LAB — BLOOD GAS, ARTERIAL
ACID-BASE EXCESS: 2.1 mmol/L — AB (ref 0.0–2.0)
BICARBONATE: 28.2 meq/L — AB (ref 20.0–24.0)
Drawn by: 235881
O2 CONTENT: 4 L/min
O2 Saturation: 98 %
PATIENT TEMPERATURE: 98.6
PCO2 ART: 61.5 mmHg — AB (ref 35.0–45.0)
PH ART: 7.283 — AB (ref 7.350–7.450)
PO2 ART: 107 mmHg — AB (ref 80.0–100.0)
TCO2: 30.1 mmol/L (ref 0–100)

## 2014-11-04 LAB — FERRITIN: Ferritin: 600 ng/mL — ABNORMAL HIGH (ref 24–336)

## 2014-11-04 LAB — IRON AND TIBC
Iron: 31 ug/dL — ABNORMAL LOW (ref 45–182)
SATURATION RATIOS: 19 % (ref 17.9–39.5)
TIBC: 161 ug/dL — AB (ref 250–450)
UIBC: 130 ug/dL

## 2014-11-04 LAB — CBC
HEMATOCRIT: 33.3 % — AB (ref 39.0–52.0)
HEMOGLOBIN: 10.5 g/dL — AB (ref 13.0–17.0)
MCH: 36.1 pg — ABNORMAL HIGH (ref 26.0–34.0)
MCHC: 31.5 g/dL (ref 30.0–36.0)
MCV: 114.4 fL — AB (ref 78.0–100.0)
Platelets: 259 10*3/uL (ref 150–400)
RBC: 2.91 MIL/uL — AB (ref 4.22–5.81)
RDW: 15 % (ref 11.5–15.5)
WBC: 9.3 10*3/uL (ref 4.0–10.5)

## 2014-11-04 LAB — VITAMIN B12: Vitamin B-12: 833 pg/mL (ref 180–914)

## 2014-11-04 LAB — RETICULOCYTES
RBC.: 2.89 MIL/uL — AB (ref 4.22–5.81)
RETIC CT PCT: 1.8 % (ref 0.4–3.1)
Retic Count, Absolute: 52 10*3/uL (ref 19.0–186.0)

## 2014-11-04 LAB — FOLATE: Folate: 46.4 ng/mL (ref >5.9)

## 2014-11-04 LAB — AMMONIA: AMMONIA: 46 umol/L — AB (ref 9–35)

## 2014-11-04 LAB — MRSA PCR SCREENING: MRSA by PCR: NEGATIVE

## 2014-11-04 MED ORDER — CHLORHEXIDINE GLUCONATE 0.12 % MT SOLN
15.0000 mL | Freq: Two times a day (BID) | OROMUCOSAL | Status: DC
  Administered 2014-11-04 – 2014-11-06 (×5): 15 mL via OROMUCOSAL
  Filled 2014-11-04 (×2): qty 15

## 2014-11-04 MED ORDER — CETYLPYRIDINIUM CHLORIDE 0.05 % MT LIQD
7.0000 mL | Freq: Two times a day (BID) | OROMUCOSAL | Status: DC
  Administered 2014-11-04 – 2014-11-05 (×2): 7 mL via OROMUCOSAL

## 2014-11-04 NOTE — Progress Notes (Signed)
  Subjective: No complaints Hgb stable  Objective: Vital signs in last 24 hours: Temp:  [97.9 F (36.6 C)-98 F (36.7 C)] 98 F (36.7 C) (08/21 0447) Pulse Rate:  [99-105] 99 (08/21 0447) Resp:  [20] 20 (08/21 0447) BP: (92-98)/(45-53) 92/45 mmHg (08/21 0447) SpO2:  [98 %-99 %] 99 % (08/21 0447) Weight:  [98.9 kg (218 lb 0.6 oz)] 98.9 kg (218 lb 0.6 oz) (08/21 0500) Last BM Date: 11/02/14  Intake/Output from previous day:   Intake/Output this shift:    No abdominal tenderness  Lab Results:   Recent Labs  11/03/14 0358 11/04/14 0644  WBC 10.8* 9.3  HGB 9.9* 10.5*  HCT 31.1* 33.3*  PLT 188 259   BMET  Recent Labs  11/02/14 0911 11/03/14 0358  NA 134* 134*  K 4.4 4.4  CL 94* 95*  CO2 28 28  GLUCOSE 90 92  BUN 26* 17  CREATININE 3.80* 2.59*  CALCIUM 9.0 8.9   PT/INR No results for input(s): LABPROT, INR in the last 72 hours. ABG No results for input(s): PHART, HCO3 in the last 72 hours.  Invalid input(s): PCO2, PO2  Studies/Results: Dg Chest Port 1 View  11/02/2014   CLINICAL DATA:  Hypoxia, acute onset. Labored breathing. Initial encounter.  EXAM: PORTABLE CHEST - 1 VIEW  COMPARISON:  Chest radiograph performed 10/15/2013, and CT of the chest performed 10/17/2014  FINDINGS: The lungs are well-aerated. Vascular congestion is noted. Mild bilateral atelectasis is seen. There is no evidence of pleural effusion or pneumothorax.  The cardiomediastinal silhouette is borderline normal in size. No acute osseous abnormalities are seen.  IMPRESSION: Vascular congestion noted.  Mild bilateral atelectasis seen.   Electronically Signed   By: Garald Balding M.D.   On: 11/02/2014 23:33    Anti-infectives: Anti-infectives    Start     Dose/Rate Route Frequency Ordered Stop   10/31/14 2200  acyclovir (ZOVIRAX) tablet 800 mg     800 mg Oral Once per day on Mon Wed Fri Sat 10/31/14 0359         Assessment/Plan: Fall Grade 3 splenic lac -- no longer restricted to  bedrest; up as tolerated; Hgb stable.   Right femur fx -- Non-operative In [place] brace per Dr. Percell Miller ABL anemia -- Stable Multiple medical problems -- per primary team Will sign off for now. Call if needed.   LOS: 4 days    Eldar Robitaille K. 11/04/2014

## 2014-11-04 NOTE — Progress Notes (Signed)
Speech Language Pathology Treatment: Dysphagia  Patient Details Name: Brent Mcneil MRN: [id number] DOB: 02/06/1924 Today's Date: 11/04/2014 Time: 4656-8127 SLP Time Calculation (min) (ACUTE ONLY): 9 min  Assessment / Plan / Recommendation Clinical Impression  Patient seen in radiology for potential completion of MBS per recommendations following evaluation 8/20. Patient severely lethargic, arousing for brief periods of time, largely 5-6 seconds, with maximum cueing before falling back to sleep. Diagnostic po trials of ice chips provided to assess appropriateness for MBS this date. With maximum verbal, visual, and tactile cueing, patient able to orally accept bolus and initiate oral manipulation however despite continued max SLP cueing, patient unable to maintain alert state for completion of swallow sequence with no swallow elicited. Patient not alert enough for MBS this date. Question if once alert, patient may be able to consume pos safely without completion of instrumental testing.  SLP will f/u for diagnostic po trials at bedside to determine potential to initiate a po diet and/or need for instrumental testing. RN and MD made aware.    HPI Other Pertinent Information: Brent Mcneil is a 81 y.o. male with history of ESRD on hemodialysis on Monday Wednesday and Friday, chronic atrial fibrillation not on anticoagulation secondary to AVMs, history of GI bleed secondary to AVMs, myelodysplastic syndrome on anagrelide, chronic anemia was brought to the ER to patient had a fall at his living facility. As per the patient's daughter patient slid out of his motorized wheelchair and got tangled in between the wheelchair and the cabinet. Patient did not lose consciousness. On exam in the ER patient has some contusion on his chest and CT abdomen shows possible laceration of the spleen and also fracture of the right femur distal supracondylar area. Per patient's daughter patient has become increasingly  confused over the last 2-3 days. Per daughter patient's pain medications were discontinued last month but since patient is complaining of increasing pain in his low back since Friday patient was placed back on oxycodone. MRI of the brain did not show anything acute. Chest x ray showed Vascular congestion and mild bilateral atelectasis   Pertinent Vitals Pain Assessment: Faces Faces Pain Scale: No hurt  SLP Plan  Goals updated    Recommendations Diet recommendations: NPO Medication Administration: Via alternative means              Oral Care Recommendations: Oral care QID Follow up Recommendations:  (TBD) Plan: Goals updated    Vonore Chester, [place] [phone]   Teodora Baumgarten Meryl 11/04/2014, 11:25 AM

## 2014-11-04 NOTE — Progress Notes (Signed)
[facility] PROGRESS NOTE  Brent Mcneil KGM:[id number] DOB: 1923-06-05 DOA: 10/19/2014 PCP: Bonnita Nasuti, MD Interim summary: Brent Mcneil is a 81 y.o. male with history of ESRD on hemodialysis on Monday Wednesday and Friday, chronic atrial fibrillation not on anticoagulation secondary to AVMs, history of GI bleed secondary to AVMs, myelodysplastic syndrome on anagrelide, chronic anemia was brought to the ER to patient had a fall at his living facility, found to have femur fracture and lacerated spleen. Surgery and orthopedics consulted .  Assessment/Plan: 1. Grade 3 splenic laceration: - bed rest for 72 hours since admission, hemoglobin stable. Appreciate surgery recommendations. OOB and PT eval.   2. Right femur fracture: Non opertive in bledsoe Brace per orthopedics.  Wheel chair bound.   3. Mild anemia: macrocytic. Will get anemia panel.  Stable.   4. ESRD on HD Further management as per orthopedics.    5. Hypotension: resolved.   6. Chronic atrial fibrillation: rate controlled.   7.hypoxia after HD 8/19:  CXR ordered, showed congestion. He is currently on 3 lit of [place] oxygen  8.  Acute encephalopathy: possibly from respiratory acidosis and hypercarbia.  Will transfer him to step down and start him on bIPAP Recheck ABG IN 6 hours.   9. Coughing on po intake: - NPO, SLP evaluation, with MBS ordered, but couldn't be done as he is very lethargic.   Code Status:DNR Family Communication: FAMILY AT bedside Disposition Plan: back to snf when stable..   Consultants:  Surgery  Orthopedics.   Procedures:  none  Antibiotics:  none  HPI/Subjective: SOMNOLENT,  Objective: Filed Vitals:   11/04/14 0447  BP: 92/45  Pulse: 99  Temp: 98 F (36.7 C)  Resp: 20    Intake/Output Summary (Last 24 hours) at 11/04/14 1535 Last data filed at 11/04/14 0830  Gross per 24 hour  Intake      0 ml  Output      0 ml  Net      0 ml   Filed Weights    11/02/14 0355 11/03/14 0505 11/04/14 0500  Weight: 101.1 kg (222 lb 14.2 oz) 97 kg (213 lb 13.5 oz) 98.9 kg (218 lb 0.6 oz)    Exam:   General:  drowsy afebrile comfortable  Cardiovascular: s1s2  Respiratory: ctab, diminished at bases.   Abdomen: soft mild tender ness int he right upper quadrant, bowel sounds heard  Musculoskeletal: trace edema.   Data Reviewed: Basic Metabolic Panel:  Recent Labs Lab 10/17/2014 2140 10/31/14 0456 11/01/14 0156 11/02/14 0911 11/03/14 0358  NA 135 132* 136 134* 134*  K 3.6 3.7 4.3 4.4 4.4  CL 91* 92* 97* 94* 95*  CO2 32 27 28 28 28   GLUCOSE 120* 104* 89 90 92  BUN 28* 32* 17 26* 17  CREATININE 3.75* 3.92* 2.66* 3.80* 2.59*  CALCIUM 8.8* 8.5* 8.9 9.0 8.9  PHOS  --   --  4.2 5.5* 4.5   Liver Function Tests:  Recent Labs Lab 10/20/2014 2140 10/31/14 0456 11/01/14 0156 11/02/14 0911 11/03/14 0358  AST 26 25  --  26 31  ALT 9* 9*  --  10* 11*  ALKPHOS 128* 124  --  106 107  BILITOT 0.9 1.2  --  1.3* 1.0  PROT 7.9 7.9  --  6.9 7.1  ALBUMIN 2.9* 3.0* 2.8* 2.8* 2.7*   No results for input(s): LIPASE, AMYLASE in the last 168 hours.  Recent Labs Lab 10/31/14 0456 11/04/14 1212  AMMONIA 42* 46*  CBC:  Recent Labs Lab 10/15/2014 2140  11/01/14 0854 11/01/14 1439 11/02/14 0911 11/03/14 0358 11/04/14 0644  WBC 9.5  < > 9.7 10.3 10.7* 10.8* 9.3  NEUTROABS 7.5  --   --   --   --   --   --   HGB 10.6*  < > 10.5* 9.9* 10.5* 9.9* 10.5*  HCT 31.8*  < > 32.1* 30.4* 31.6* 31.1* 33.3*  MCV 112.0*  < > 112.2* 112.6* 111.3* 113.1* 114.4*  PLT 228  < > 178 197 229 188 259  < > = values in this interval not displayed. Cardiac Enzymes:  Recent Labs Lab 10/31/14 0456  CKTOTAL 28*  TROPONINI 0.13*   BNP (last 3 results) No results for input(s): BNP in the last 8760 hours.  ProBNP (last 3 results) No results for input(s): PROBNP in the last 8760 hours.  CBG:  Recent Labs Lab 10/31/14 1646  GLUCAP 88    No results found  for this or any previous visit (from the past 240 hour(s)).   Studies: Dg Chest Port 1 View  11/02/2014   CLINICAL DATA:  Hypoxia, acute onset. Labored breathing. Initial encounter.  EXAM: PORTABLE CHEST - 1 VIEW  COMPARISON:  Chest radiograph performed 10/15/2013, and CT of the chest performed 10/24/2014  FINDINGS: The lungs are well-aerated. Vascular congestion is noted. Mild bilateral atelectasis is seen. There is no evidence of pleural effusion or pneumothorax.  The cardiomediastinal silhouette is borderline normal in size. No acute osseous abnormalities are seen.  IMPRESSION: Vascular congestion noted.  Mild bilateral atelectasis seen.   Electronically Signed   By: Garald Balding M.D.   On: 11/02/2014 23:33    Scheduled Meds: . acyclovir  800 mg Oral Once per day on Mon Wed Fri Sat  . anagrelide  0.5 mg Oral QODAY  . cinacalcet  30 mg Oral Q supper  . darbepoetin (ARANESP) injection - DIALYSIS  150 mcg Intravenous Q Fri-HD  . docusate sodium  100 mg Oral BID  . febuxostat  40 mg Oral Daily  . feeding supplement  1 Container Oral Q24H  . feeding supplement (NEPRO CARB STEADY)  237 mL Oral BID PC  . [START ON 2014-11-21] ferric gluconate (FERRLECIT/NULECIT) IV  125 mg Intravenous Q Wed-HD  . finasteride  5 mg Oral Daily  . multivitamin  1 tablet Oral QHS  . neomycin-polymyxin b-dexamethasone   Left Eye QHS  . pantoprazole  40 mg Oral Daily  . sevelamer carbonate  1,600 mg Oral TID WC  . sodium chloride  3 mL Intravenous Q12H   Continuous Infusions:   Principal Problem:   Splenic laceration Active Problems:   MDS (myelodysplastic syndrome)   Chronic atrial fibrillation   ESRD on dialysis   Chronic anemia   Femur fracture, right   Acute encephalopathy   Malnutrition of moderate degree    Time spent: 25 minutes/     [place]  [facility] Pager [phone]  If 7PM-7AM, please contact night-coverage at [email or web address], password [facility] 11/04/2014, 3:35 PM  LOS: 4 days

## 2014-11-04 NOTE — Progress Notes (Signed)
Panic level arterial CO2 61.5 called in from lab. Notified attending MD, Dr. Karleen Hampshire. Orders to transfer pt to Step down unit for continuous bi-pap.

## 2014-11-04 NOTE — Progress Notes (Signed)
Pt was transferred to room 2H24 on a [place]. RT placed pt on bi-pap per MD order. RT will continue to monitor.

## 2014-11-04 NOTE — Evaluation (Signed)
Physical Therapy Evaluation Patient Details Name: Brent Mcneil MRN: [id number] DOB: 03/02/1924 Today's Date: 11/04/2014   History of Present Illness  Brent Mcneil is a 81 y.o. male with history of ESRD on hemodialysis on Monday Wednesday and Friday, chronic atrial fibrillation not on anticoagulation secondary to AVMs, history of GI bleed secondary to AVMs, myelodysplastic syndrome on anagrelide, chronic anemia was brought to the ER to patient had a fall at his living facility. As per the patient's daughter patient slid out of his motorized wheelchair and got tangled in between the wheelchair and the cabinet. Patient did not lose consciousness. On exam in the ER patient has some contusion on his chest and CT abdomen shows possible laceration of the spleen and also fracture of the right femur distal supracondylar area. Per patient's daughter patient has become increasingly confused over the last 2-3 days. Per daughter patient's pain medications were discontinued last month but since patient is complaining of increasing pain in his low back since Friday patient was placed back on oxycodone. MRI of the brain did not show anything acute. Chest x ray showed Vascular congestion and mild bilateral atelectasis    Clinical Impression  Pt admitted with above diagnosis. Pt currently with functional limitations due to the deficits listed below (see PT Problem List). Brent Mcneil mobility at baseline is bed<>WC w/ lift and is from assisted living.  Pt not at baseline mobility and anticipate that pt will be able to participate in therapeutic exercise once he is more alert.  Therefore, recommending PT follow acutely on trial basis.  Pt will benefit from skilled PT to increase their independence and safety with mobility to allow discharge to the venue listed below.     Follow Up Recommendations SNF;Supervision/Assistance - 24 hour    Equipment Recommendations  None recommended by PT    Recommendations  for Other Services       Precautions / Restrictions Precautions Precautions: Fall Precaution Comments: Pt has  Required Braces or Orthoses: Other Brace/Splint Other Brace/Splint: Bledsoe brace on at all times  Restrictions Weight Bearing Restrictions:  (Pt has not been ambulatory for 2 years per pt's daughter)      Mobility  Bed Mobility Overal bed mobility: Needs Assistance;+2 for physical assistance;+ 2 for safety/equipment Bed Mobility: Rolling Rolling: Total assist;+2 for physical assistance (+3 assist)         General bed mobility comments: Total +3 assist.  Supporting Rt LE, supporting trunk, and placement of lift pad.    Transfers Overall transfer level: Needs assistance               General transfer comment: Use of maximove to accomplish bed>recliner chair.  +2 assist to support Bil LEs and to maneuver lift.  Lift pad left in chair w/ pt for later use.    Ambulation/Gait                Stairs            Wheelchair Mobility    Modified Rankin (Stroke Patients Only)       Balance                                             Pertinent Vitals/Pain Pain Assessment: Faces Faces Pain Scale: Hurts little more Pain Location: Rt knee w/ mobility Pain Descriptors / Indicators: Grimacing;Moaning Pain Intervention(s): Limited activity within  patient's tolerance;Monitored during session;Repositioned    Home Living Family/patient expects to be discharged to:: Skilled nursing facility                 Additional Comments: Brent Mcneil is from an Assisted Living facility but will most likely need to go to SNF prior to return to assisted living    Prior Function Level of Independence: Needs assistance   Gait / Transfers Assistance Needed: PTA pt lives at assisted living where they use a lift to transfer pt to/from his powered [facility] which he was able to operate Ind.  His daughter reports he has stood up to transfer in over 2  years.  He lives w/ his wife at the assisted living facility.  ADL's / Homemaking Assistance Needed: Pt requires total assist for all ADLs        Hand Dominance        Extremity/Trunk Assessment   Upper Extremity Assessment: Generalized weakness           Lower Extremity Assessment: RLE deficits/detail;LLE deficits/detail;Generalized weakness;Difficult to assess due to impaired cognition RLE Deficits / Details: Rt knee contracted in flexion w/ bledsoe brace.  Per pt's daughter wound on outside of Rt ankle LLE Deficits / Details: strength grossly 2/5; limited exam 2/2 pt's lethargic state     Communication   Communication: Other (comment) (difficult to assist as pt is very lethargic)  Cognition Arousal/Alertness: Lethargic Behavior During Therapy: Flat affect Overall Cognitive Status: Difficult to assess Area of Impairment: Following commands;Orientation Orientation Level: Disoriented to;Place;Time;Situation     Following Commands: Follows one step commands inconsistently;Follows one step commands with increased time       General Comments: Pt very lethargic.  Pt opens his eyes few times during therapy session.  Did report his name and DOB and replied to yes and no questions about pain and his comfort at end of session.    General Comments General comments (skin integrity, edema, etc.): Pt not at baseline mobility and anticipate that pt will be able to participate in therapeutic exercise once he is more alert.  Therefore, recommending PT follow acutely on trial basis.    Exercises General Exercises - Upper Extremity Elbow Flexion: PROM;Both;5 reps;Seated Elbow Extension: PROM;Both;5 reps;Seated General Exercises - Lower Extremity Long Arc Quad: Left;AROM;AAROM;5 reps;Seated      Assessment/Plan    PT Assessment Patient needs continued PT services  PT Diagnosis Difficulty walking;Generalized weakness;Acute pain;Altered mental status   PT Problem List Decreased  strength;Decreased range of motion;Decreased activity tolerance;Decreased balance;Decreased mobility;Decreased cognition;Decreased knowledge of use of DME;Decreased safety awareness;Decreased knowledge of precautions;Decreased skin integrity;Pain;Impaired sensation  PT Treatment Interventions DME instruction;Functional mobility training;Therapeutic activities;Balance training;Therapeutic exercise;Neuromuscular re-education;Cognitive remediation;Patient/family education;Modalities;Wheelchair mobility training   PT Goals (Current goals can be found in the Care Plan section) Acute Rehab PT Goals Patient Stated Goal: pt unable to state PT Goal Formulation: With family Time For Goal Achievement: 11/18/14 Potential to Achieve Goals: Fair    Frequency Min 2X/week   Barriers to discharge        Co-evaluation               End of Session Equipment Utilized During Treatment: Other (comment) (Maximove and Rt knee bledsoe brace) Activity Tolerance: Patient limited by fatigue;Treatment limited secondary to medical complications (Comment) (pt very lethargic) Patient left: in chair;with call bell/phone within reach;with family/visitor present;with SCD's reapplied Nurse Communication: Mobility status;Precautions;Need for lift equipment         Time: [id number] PT Time Calculation (  min) (ACUTE ONLY): 37 min   Charges:   PT Evaluation $Initial PT Evaluation Tier I: 1 Procedure PT Treatments $Therapeutic Activity: 8-22 mins   PT G Codes:       Joslyn Hy PT, DPT [phone] Pager: [phone] 11/04/2014, 2:08 PM

## 2014-11-04 NOTE — Progress Notes (Signed)
Subjective: Interval History: has no complaint, doing better.  Objective: Vital signs in last 24 hours: Temp:  [97.9 F (36.6 C)-98 F (36.7 C)] 98 F (36.7 C) (08/21 0447) Pulse Rate:  [99-105] 99 (08/21 0447) Resp:  [20] 20 (08/21 0447) BP: (92-98)/(45-53) 92/45 mmHg (08/21 0447) SpO2:  [98 %-99 %] 99 % (08/21 0447) Weight:  [98.9 kg (218 lb 0.6 oz)] 98.9 kg (218 lb 0.6 oz) (08/21 0500) Weight change: 1.9 kg (4 lb 3 oz)  Intake/Output from previous day:   Intake/Output this shift:    General appearance: pale and Ox 1, lethargic , slow mentation.  Resp: diminished breath sounds bilaterally and rales bibasilar Cardio: irregularly irregular rhythm and systolic murmur: holosystolic 2/6, blowing at apex GI: pos bs, soft Extremities: edema 3-4+ and AVF LUA B&T  Colostomy L lower abdm  Lab Results:  Recent Labs  11/03/14 0358 11/04/14 0644  WBC 10.8* 9.3  HGB 9.9* 10.5*  HCT 31.1* 33.3*  PLT 188 259   BMET:  Recent Labs  11/02/14 0911 11/03/14 0358  NA 134* 134*  K 4.4 4.4  CL 94* 95*  CO2 28 28  GLUCOSE 90 92  BUN 26* 17  CREATININE 3.80* 2.59*  CALCIUM 9.0 8.9   No results for input(s): PTH in the last 72 hours. Iron Studies: No results for input(s): IRON, TIBC, TRANSFERRIN, FERRITIN in the last 72 hours.  Studies/Results: Dg Chest Port 1 View  11/02/2014   CLINICAL DATA:  Hypoxia, acute onset. Labored breathing. Initial encounter.  EXAM: PORTABLE CHEST - 1 VIEW  COMPARISON:  Chest radiograph performed 10/15/2013, and CT of the chest performed 10/26/2014  FINDINGS: The lungs are well-aerated. Vascular congestion is noted. Mild bilateral atelectasis is seen. There is no evidence of pleural effusion or pneumothorax.  The cardiomediastinal silhouette is borderline normal in size. No acute osseous abnormalities are seen.  IMPRESSION: Vascular congestion noted.  Mild bilateral atelectasis seen.   Electronically Signed   By: Garald Balding M.D.   On: 11/02/2014 23:33     I have reviewed the patient's current medications.  Assessment/Plan: 1 ESRD vol xs but low bps.  Needs to be able to sit up in chair for 6 h 2 Hypotension still an issue despite severe vol xs. 3 Anemia stable 4 HPTH 5 Femur fx may determine outcome 6 Old polio 7 colostomy  8 GI AVMS 9 Afib 10 Myelodysplasia 11 confusion ongoig P HD, try up in chair, slow vol off. A lot of obstacles to overcome prior to d/c   LOS: 4 days   Harrie Cazarez L 11/04/2014,9:22 AM

## 2014-11-05 ENCOUNTER — Inpatient Hospital Stay (HOSPITAL_COMMUNITY): Payer: Medicare Other

## 2014-11-05 DIAGNOSIS — Z789 Other specified health status: Secondary | ICD-10-CM

## 2014-11-05 DIAGNOSIS — Z515 Encounter for palliative care: Secondary | ICD-10-CM | POA: Insufficient documentation

## 2014-11-05 LAB — CBC WITH DIFFERENTIAL/PLATELET
BASOS ABS: 0 10*3/uL (ref 0.0–0.1)
Basophils Relative: 0 % (ref 0–1)
EOS ABS: 0.1 10*3/uL (ref 0.0–0.7)
Eosinophils Relative: 1 % (ref 0–5)
HEMATOCRIT: 32.6 % — AB (ref 39.0–52.0)
Hemoglobin: 10.5 g/dL — ABNORMAL LOW (ref 13.0–17.0)
LYMPHS ABS: 0.8 10*3/uL (ref 0.7–4.0)
LYMPHS PCT: 7 % — AB (ref 12–46)
MCH: 37 pg — ABNORMAL HIGH (ref 26.0–34.0)
MCHC: 32.2 g/dL (ref 30.0–36.0)
MCV: 114.8 fL — ABNORMAL HIGH (ref 78.0–100.0)
MONOS PCT: 11 % (ref 3–12)
Monocytes Absolute: 1.2 10*3/uL — ABNORMAL HIGH (ref 0.1–1.0)
NEUTROS ABS: 8.7 10*3/uL — AB (ref 1.7–7.7)
Neutrophils Relative %: 81 % — ABNORMAL HIGH (ref 43–77)
PLATELETS: 291 10*3/uL (ref 150–400)
RBC: 2.84 MIL/uL — AB (ref 4.22–5.81)
RDW: 14.9 % (ref 11.5–15.5)
WBC: 10.8 10*3/uL — AB (ref 4.0–10.5)

## 2014-11-05 LAB — BLOOD GAS, ARTERIAL
ACID-BASE DEFICIT: 2.1 mmol/L — AB (ref 0.0–2.0)
Acid-base deficit: 0.8 mmol/L (ref 0.0–2.0)
BICARBONATE: 24.5 meq/L — AB (ref 20.0–24.0)
BICARBONATE: 23.3 meq/L (ref 20.0–24.0)
DELIVERY SYSTEMS: POSITIVE
DELIVERY SYSTEMS: POSITIVE
DRAWN BY: 41308
Drawn by: 313941
EXPIRATORY PAP: 6
EXPIRATORY PAP: 6
FIO2: 0.4
FIO2: 0.4
INSPIRATORY PAP: 14
Inspiratory PAP: 12
LHR: 8 {breaths}/min
O2 Saturation: 99.2 %
O2 Saturation: 99.1 %
PATIENT TEMPERATURE: 98.6
PH ART: 7.32 — AB (ref 7.350–7.450)
PH ART: 7.309 — AB (ref 7.350–7.450)
PO2 ART: 140 mmHg — AB (ref 80.0–100.0)
Patient temperature: 98.3
RATE: 10 resp/min
TCO2: 26 mmol/L (ref 0–100)
TCO2: 24.8 mmol/L (ref 0–100)
pCO2 arterial: 48.9 mmHg — ABNORMAL HIGH (ref 35.0–45.0)
pCO2 arterial: 47.8 mmHg — ABNORMAL HIGH (ref 35.0–45.0)
pO2, Arterial: 140 mmHg — ABNORMAL HIGH (ref 80.0–100.0)

## 2014-11-05 LAB — BASIC METABOLIC PANEL
Anion gap: 16 — ABNORMAL HIGH (ref 5–15)
BUN: 36 mg/dL — ABNORMAL HIGH (ref 6–20)
CALCIUM: 9.1 mg/dL (ref 8.9–10.3)
CO2: 24 mmol/L (ref 22–32)
CREATININE: 4.71 mg/dL — AB (ref 0.61–1.24)
Chloride: 97 mmol/L — ABNORMAL LOW (ref 101–111)
GFR calc Af Amer: 11 mL/min — ABNORMAL LOW (ref >60)
GFR, EST NON AFRICAN AMERICAN: 10 mL/min — AB (ref >60)
Glucose, Bld: 66 mg/dL (ref 65–99)
Potassium: 4.8 mmol/L (ref 3.5–5.1)
SODIUM: 137 mmol/L (ref 135–145)

## 2014-11-05 MED ORDER — ALTEPLASE 2 MG IJ SOLR
2.0000 mg | Freq: Once | INTRAMUSCULAR | Status: DC | PRN
  Filled 2014-11-05: qty 2

## 2014-11-05 MED ORDER — NEPRO/CARBSTEADY PO LIQD
237.0000 mL | ORAL | Status: DC | PRN
  Filled 2014-11-05: qty 237

## 2014-11-05 MED ORDER — HEPARIN SODIUM (PORCINE) 1000 UNIT/ML DIALYSIS: 40.0000 [IU]/kg | Freq: Once | INTRAMUSCULAR | Status: DC

## 2014-11-05 MED ORDER — SODIUM CHLORIDE 0.9 % IV SOLN: 100.0000 mL | INTRAVENOUS | Status: DC | PRN

## 2014-11-05 MED ORDER — LIDOCAINE HCL (PF) 1 % IJ SOLN: 5.0000 mL | INTRAMUSCULAR | Status: DC | PRN

## 2014-11-05 MED ORDER — HEPARIN SODIUM (PORCINE) 1000 UNIT/ML DIALYSIS: 1000.0000 [IU] | INTRAMUSCULAR | Status: DC | PRN

## 2014-11-05 MED ORDER — LIDOCAINE-PRILOCAINE 2.5-2.5 % EX CREA: 1.0000 "application " | TOPICAL_CREAM | CUTANEOUS | Status: DC | PRN

## 2014-11-05 MED ORDER — SODIUM CHLORIDE 0.9 % IV BOLUS (SEPSIS): 250.0000 mL | Freq: Once | INTRAVENOUS | Status: DC

## 2014-11-05 MED ORDER — ALBUMIN HUMAN 25 % IV SOLN
12.5000 g | INTRAVENOUS | Status: AC
  Administered 2014-11-05: 12.5 g via INTRAVENOUS
  Filled 2014-11-05 (×2): qty 50

## 2014-11-05 MED ORDER — PENTAFLUOROPROP-TETRAFLUOROETH EX AERO: 1.0000 "application " | INHALATION_SPRAY | CUTANEOUS | Status: DC | PRN

## 2014-11-05 NOTE — Care Management Important Message (Addendum)
Important Message  Patient Details  Name: Brent Mcneil MRN: [id number] Date of Birth: December 14, 1923   Medicare Important Message Given:  Yes-second notification given    Lacretia Leigh, RN 11/05/2014, 9:24 AM

## 2014-11-05 NOTE — Consult Note (Signed)
Consultation Note Date: 11/05/2014   Patient Name: Brent Mcneil  DOB: 16-Oct-1923  MRN: [id number]  Age / Sex: 81 y.o., male   PCP: Raelyn Number, MD Referring Physician: Hosie Poisson, MD  Reason for Consultation: Establishing goals of care  Palliative Care Assessment and Plan Summary of Established Goals of Care and Medical Treatment Preferences   Clinical Assessment/Narrative: Brent Mcneil is an 81 y.o. male with history of polio as a child, ESRD on dialysis, chronic Afib not on AC due to AVM and GI bleed who slid out of his motorized wheelchair and got wedged in between the wheelchair and the cabinet at the assisted living facility where he resides. Patient did not lose consciousness. CT abdomen revealed possible laceration of the spleen and fracture of the right femur distal supracondylar area. MRI brain on admission was negative for CVA. He became very drowsy following admission was noted to have CO2 greater than 60. Patient was placed on Bi-pap and transferred to step down. Palliative was consulted for goals of care moving forward in light of his acute injuries on top of chronic medical problems.  I met today with his daughters Jenny Reichmann, who is his POA, and Manuela Schwartz. They report that their father has been having a good quality of life at the assisted living facility where he resides. He and their mother share a room and have many friends that they interact with regularly. The father enjoys using his motorized wheelchair in order to go to bingo. They report that he has 3 children and 7 grandchildren who are the important thing to him. He is very involved in keeping up with his grandchildren's activities.  They report he is a retired Psychologist, prison and probation services and following retirement he really focused on a small farm that they lived on prior to moving to assisted living facility. This farm is where he raised his children. He was also an avid beekeeper. He enjoys all sorts of sports, but particularly  [employer] baseball.  His daughter reports that she has noted that he has been having some decreased functional status over the past few months. Additionally he has been more sleepy and also his nutrition has been decreasing. He did not do well with renal diet whenever he started on dialysis 3 years ago, and so he has been taking in small amounts of whenever foods he wants at that time.  He has completed a living will and does not want aggressive measures at the end of his life. At the same time, they feel that he would want to have limited amount of interventions up until intubation in order to see if he improves regaining quality of life he had prior at the assisted living facility.  They report that they were in a similar situation where physicians were recommending focus on comfort care approximately 3 years ago. This is when his renal disease progressed. At that time, the father had a few hours where he became more clear and stated he would want to pursue dialysis. Since that point in time he has been doing well on intermittent HD 3 days per week. Based on this, his daughters think that he would want to continue with limited interventions to see if he improves.  When asked what his thoughts would be on his current situation, his daughters report they are not quite sure. They still think that he is interacting with them occasionally and therefore "he is in there."  I told his daughters that I would like to continue  conversation about it is most important to their father and how we can continue to align our goals of care to work towards these goals. I asked what their father's thoughts would be if he had little chance of regaining enough independence to live at the assisted living facility with her mother again. They stated they would need to think about this.  The daughters agreed it would be a good idea to reassess his clinical situation tomorrow and reconvene in order to continue conversation  regarding goals of care moving forward. This will allow them to consider as a family what is most important to their father moving forward. They would like their are mother to be part of this conversation as well as their brother.  - Family would like to continue with current level of care and see how he does over the next 24 hours. We are planning for a family meeting tomorrow at 5 PM at which point we will continue to discuss how to best move towards goals that would be most important to Brent Mcneil.  Contacts/Participants in Discussion: Primary Decision Maker: His daughter, Brent Mcneil HCPOA: yes   Code Status/Advance Care Planning:  DO NOT RESUSCITATE  Symptom Management:   Patient somnolent and unable to stress active symptoms. He does appear comfortable  Psycho-social/Spiritual:   Support System: Family including his wife, he lives with her at an assisted-living facility  Desire for further Chaplaincy support:no  Prognosis: Unable to determine due to acute events. Without continued intervention, including hemodialysis and BiPAP, his time would be short in the matter of hours to days.  Discharge Planning:  To be determined by clinical course       Chief Complaint/History of Present Illness:  Patient nonverbal (on Bipap with altered mental status)  Primary Diagnoses  Present on Admission:  . Splenic laceration . Chronic atrial fibrillation . Chronic anemia . MDS (myelodysplastic syndrome) . Femur fracture, right . Acute encephalopathy  Palliative Review of Systems: Unable to complete due to mental status I have reviewed the medical record, interviewed the patient and family, and examined the patient. The following aspects are pertinent.  Past Medical History  Diagnosis Date  . Chronic kidney disease     HD- M-W-F  . End stage renal disease   . Uremic encephalopathy     Metabolic  . Anemia   . Gastrointestinal bleed   . Ulcer     Peptic and gastric   .  Ulcerative esophagitis   . Myeloproliferative disorder   . Macular degeneration 1990s    bilateral  . History of thrombocytosis   . Myocardial infarction 1996  . Polio age 69  . Pneumonia   . Stroke     approx 6-7 years ago, affected speech for a short time  . Atrial flutter   . Atrial fibrillation 08/2012    treated with Carvedilol, Echo done 09/22/12  . Laceration of spleen 10/20/2014  . Fracture closed, femur, shaft 10/2014    RT    Social History   Social History  . Marital Status: Married    Spouse Name: N/A  . Number of Children: N/A  . Years of Education: N/A   Social History Main Topics  . Smoking status: Former Smoker    Quit date: 09/28/1956  . Smokeless tobacco: Former Systems developer    Types: Chew  . Alcohol Use: No  . Drug Use: No  . Sexual Activity: Not Asked   Other Topics Concern  . None   Social  History Narrative   Family History  Problem Relation Age of Onset  . Cancer Father   . Lymphoma Brother   . Lung cancer Sister   . Emphysema Sister    Scheduled Meds: . anagrelide  0.5 mg Oral QODAY  . antiseptic oral rinse  7 mL Mouth Rinse q12n4p  . chlorhexidine  15 mL Mouth Rinse BID  . cinacalcet  30 mg Oral Q supper  . darbepoetin (ARANESP) injection - DIALYSIS  150 mcg Intravenous Q Fri-HD  . docusate sodium  100 mg Oral BID  . febuxostat  40 mg Oral Daily  . feeding supplement  1 Container Oral Q24H  . feeding supplement (NEPRO CARB STEADY)  237 mL Oral BID PC  . [START ON 11/20/14] ferric gluconate (FERRLECIT/NULECIT) IV  125 mg Intravenous Q Wed-HD  . finasteride  5 mg Oral Daily  . multivitamin  1 tablet Oral QHS  . neomycin-polymyxin b-dexamethasone   Left Eye QHS  . pantoprazole  40 mg Oral Daily  . sevelamer carbonate  1,600 mg Oral TID WC  . sodium chloride  3 mL Intravenous Q12H   Continuous Infusions:  PRN Meds:.acetaminophen **OR** acetaminophen, levalbuterol, loratadine, ondansetron **OR** ondansetron (ZOFRAN) IV Medications Prior to  Admission:  Prior to Admission medications   Medication Sig Start Date End Date Taking? Authorizing Provider  acetaminophen (TYLENOL) 500 MG tablet Take 1,000 mg by mouth every 6 (six) hours as needed for fever (pain).   Yes Historical Provider, MD  acyclovir (ZOVIRAX) 800 MG tablet Take 800 mg by mouth See admin instructions. On Monday, Wednesday, Friday and Saturday at bedtime   Yes Historical Provider, MD  anagrelide (AGRYLIN) 0.5 MG capsule Take 0.5 mg by mouth every other day.   Yes Historical Provider, MD  B Complex-C-Folic Acid (FULL SPECTRUM B/VITAMIN C PO) Take 1 tablet by mouth daily.   Yes Historical Provider, MD  beta carotene 25000 UNIT capsule Take 25,000 Units by mouth daily.    Yes Historical Provider, MD  carvedilol (COREG) 6.25 MG tablet Take 6.25 mg by mouth See admin instructions. Take 1 tablet (6.25 mg) twice daily, except: hold morning dose on Monday, Wednesday and Friday if SBP<110   Yes Historical Provider, MD  cinacalcet (SENSIPAR) 30 MG tablet Take 30 mg by mouth daily with supper.    Yes Historical Provider, MD  diphenhydrAMINE (BENADRYL) 12.5 MG/5ML liquid Take 12.5 mg by mouth every 6 (six) hours as needed for itching.   Yes Historical Provider, MD  docusate sodium (COLACE) 100 MG capsule Take 100 mg by mouth 2 (two) times daily.   Yes Historical Provider, MD  febuxostat (ULORIC) 40 MG tablet Take 40 mg by mouth daily.   Yes Historical Provider, MD  finasteride (PROSCAR) 5 MG tablet Take 5 mg by mouth daily.    Yes Historical Provider, MD  ketoconazole (NIZORAL) 2 % shampoo Apply 1 application topically daily.   Yes Historical Provider, MD  loratadine (CLARITIN) 10 MG tablet Take 10 mg by mouth daily as needed for allergies.   Yes Historical Provider, MD  metoCLOPramide (REGLAN) 5 MG tablet Take 5 mg by mouth 4 (four) times daily -  before meals and at bedtime.   Yes Historical Provider, MD  miconazole (BAZA ANTIFUNGAL) 2 % cream Apply 1 application topically as needed.    Yes Historical Provider, MD  omeprazole (PRILOSEC) 20 MG capsule Take 40 mg by mouth daily.   Yes Historical Provider, MD  ondansetron (ZOFRAN) 4 MG tablet Take 4 mg  by mouth every 4 (four) hours as needed for vomiting.   Yes Historical Provider, MD  Oxycodone HCl 10 MG TABS Take 10 mg by mouth 2 (two) times daily as needed (for pain control).   Yes Historical Provider, MD  polyvinyl alcohol (LIQUIFILM TEARS) 1.4 % ophthalmic solution Place 1 drop into both eyes every 2 (two) hours as needed for dry eyes.   Yes Historical Provider, MD  Povidone-Iodine 10 % MISC Apply topically daily. To 5th MTP ulcer and 4th and 5th toe ulcers   Yes Historical Provider, MD  prednisoLONE acetate (PRED FORTE) 1 % ophthalmic suspension Place 1 drop into the left eye 2 (two) times daily.   Yes Historical Provider, MD  sevelamer carbonate (RENVELA) 800 MG tablet Take 1,600 mg by mouth 3 (three) times daily with meals.   Yes Historical Provider, MD  SIMPLY SALINE NA Apply topically daily. To clean wounds on the top of right foot and right 4th toe   Yes Historical Provider, MD  traMADol (ULTRAM) 50 MG tablet Take 50 mg by mouth every 6 (six) hours as needed (pain).    Yes Historical Provider, MD  acetaminophen (TYLENOL) 325 MG tablet Take 1 tablet (325 mg total) by mouth every 6 (six) hours as needed for moderate pain (take with oxycodone until percocet arrives.  Do not take with percocet). Patient not taking: Reported on 04/01/2014 10/15/13   [name], MD   Allergies  Allergen Reactions  . Lubiprostone Diarrhea   CBC:    Component Value Date/Time   WBC 10.8* 11/05/2014 0849   HGB 10.5* 11/05/2014 0849   HCT 32.6* 11/05/2014 0849   PLT 291 11/05/2014 0849   MCV 114.8* 11/05/2014 0849   NEUTROABS 8.7* 11/05/2014 0849   LYMPHSABS 0.8 11/05/2014 0849   MONOABS 1.2* 11/05/2014 0849   EOSABS 0.1 11/05/2014 0849   BASOSABS 0.0 11/05/2014 0849   Comprehensive Metabolic Panel:    Component Value Date/Time    NA 137 11/05/2014 0849   K 4.8 11/05/2014 0849   CL 97* 11/05/2014 0849   CO2 24 11/05/2014 0849   BUN 36* 11/05/2014 0849   CREATININE 4.71* 11/05/2014 0849   GLUCOSE 66 11/05/2014 0849   CALCIUM 9.1 11/05/2014 0849   AST 31 11/03/2014 0358   ALT 11* 11/03/2014 0358   ALKPHOS 107 11/03/2014 0358   BILITOT 1.0 11/03/2014 0358   PROT 7.1 11/03/2014 0358   ALBUMIN 2.7* 11/03/2014 0358    Physical Exam: Vital Signs: BP 92/47 mmHg  Pulse 117  Temp(Src) 97.3 F (36.3 C) (Axillary)  Resp 24  Ht 6' (1.829 m)  Wt 94.8 kg (208 lb 15.9 oz)  BMI 28.34 kg/m2  SpO2 88% SpO2: SpO2: (!) 88 % O2 Device: O2 Device: Bi-PAP O2 Flow Rate: O2 Flow Rate (L/min): 4 L/min Intake/output summary:  Intake/Output Summary (Last 24 hours) at 11/05/14 1817 Last data filed at 11/05/14 0835  Gross per 24 hour  Intake      0 ml  Output      0 ml  Net      0 ml   LBM: Last BM Date: 11/02/14 Baseline Weight: Weight: 97.3 kg (214 lb 8.1 oz) Most recent weight: Weight: 94.8 kg (208 lb 15.9 oz)  Exam Findings:  General: Somnolent, on BiPAP, in no acute distress.  HEENT: No bruits, no goiter, no JVD Heart: Regular rate. No murmur appreciated. Lungs: Good air movement on BiPAP Abdomen: Soft, nontender, nondistended, positive bowel sounds.  Ext: No significant edema  Skin: Warm and dry Neuro: Patient somnolent. Did not arouse to verbal or tactile stimulation on my exam         Palliative Performance Scale: 10-20% current  60% prior to admission               Additional Data Reviewed: Recent Labs     11/03/14  0358  11/04/14  0644  11/05/14  0849  WBC  10.8*  9.3  10.8*  HGB  9.9*  10.5*  10.5*  PLT  188  259  291  NA  134*   --   137  BUN  17   --   36*  CREATININE  2.59*   --   4.71*     Time In: 1630 Time Out: 1720 Time Total: 80 Greater than 50%  of this time was spent counseling and coordinating care related to the above assessment and plan.  Signed by: Micheline Rough, MD  Micheline Rough, MD  11/05/2014, 6:17 PM  Please contact Palliative Medicine Team phone at [phone] for questions and concerns.

## 2014-11-05 NOTE — Progress Notes (Addendum)
Notified Md about clarification of ABG for pt this am.  No morning ABG ordered at this time however Akula MD did have in progress note to recheck ABG in 6 hours after BIPAP. Will place order.   Will continue to monitor. Also made md aware of  pt's hr 110-126 afib.  bp 97/50.   Saunders Revel T

## 2014-11-05 NOTE — Progress Notes (Signed)
[facility] PROGRESS NOTE  Brent Mcneil DOB: [birth year] DOA: 11/11/2014 PCP: Bonnita Nasuti, MD Interim summary: Brent Mcneil is a 81 y.o. male with history of ESRD on hemodialysis on Monday Wednesday and Friday, chronic atrial fibrillation not on anticoagulation secondary to AVMs, history of GI bleed secondary to AVMs, myelodysplastic syndrome on anagrelide, chronic anemia was brought to the ER to patient had a fall at his living facility, found to have femur fracture and lacerated spleen. Surgery and orthopedics consulted .  Assessment/Plan: 1. Grade 3 splenic laceration: - bed rest for 72 hours since admission, hemoglobin stable. Appreciate surgery recommendations.   2. Right femur fracture: Non opertive in bledsoe Brace per orthopedics.  Wheel chair bound.   3. Mild anemia: macrocytic.  Stable.   4. ESRD on HD Further management as per renal. .    5. Hypotension: fluid boluses as needed. .   6. Chronic atrial fibrillation: rate controlled.   7.hypoxia after HD 8/19:  CXR ordered, showed congestion.  Repeat CXR today appears to be clear.   8.  Acute encephalopathy: possibly from respiratory acidosis and hypercarbia along with diuresis. Marland Kitchen   transferred him to step down and started him on bIPAP. His repeat ABG is slightly better with PH 7.3 and pco2 is 48.  Neurology consulted and repeat CT head ordered, does not show any acute stroke.  It will be followed by MRI? Vs EEG. Minimally elevated ammonia.   9. Coughing on po intake: - NPO, SLP evaluation, with MBS ordered, but couldn't be done as he is very lethargic. Family persistent that he get nutrition through NG tube, ordered panda tube and nutrition consulted to start him on tube feeds.   10. GOALS OF CARE: - palliative care consulted   Code Status:DNR Family Communication: FAMILY AT bedside Disposition Plan: back to snf when stable..   Consultants:  Surgery  Orthopedics.    Procedures:  none  Antibiotics:  none  HPI/Subjective: Brent Mcneil,  Objective: Filed Vitals:   11/05/14 1845  BP: 70/42  Pulse: 122  Temp:   Resp: 21    Intake/Output Summary (Last 24 hours) at 11/05/14 1903 Last data filed at 11/05/14 0835  Gross per 24 hour  Intake      0 ml  Output      0 ml  Net      0 ml   Filed Weights   11/03/14 0505 11/04/14 0500 11/05/14 0312  Weight: 97 kg (213 lb 13.5 oz) 98.9 kg (218 lb 0.6 oz) 94.8 kg (208 lb 15.9 oz)    Exam:   General:  drowsy afebrile comfortable  Cardiovascular: s1s2  Respiratory: ctab, diminished at bases.   Abdomen: soft mild tender ness int he right upper quadrant, bowel sounds heard  Musculoskeletal: trace edema.   Data Reviewed: Basic Metabolic Panel:  Recent Labs Lab 10/31/14 0456 11/01/14 0156 11/02/14 0911 11/03/14 0358 11/05/14 0849  NA 132* 136 134* 134* 137  K 3.7 4.3 4.4 4.4 4.8  CL 92* 97* 94* 95* 97*  CO2 27 28 28 28 24   GLUCOSE 104* 89 90 92 66  BUN 32* 17 26* 17 36*  CREATININE 3.92* 2.66* 3.80* 2.59* 4.71*  CALCIUM 8.5* 8.9 9.0 8.9 9.1  PHOS  --  4.2 5.5* 4.5  --    Liver Function Tests:  Recent Labs Lab 10/21/2014 2140 10/31/14 0456 11/01/14 0156 11/02/14 0911 11/03/14 0358  AST 26 25  --  26 31  ALT 9* 9*  --  10* 11*  ALKPHOS 128* 124  --  106 107  BILITOT 0.9 1.2  --  1.3* 1.0  PROT 7.9 7.9  --  6.9 7.1  ALBUMIN 2.9* 3.0* 2.8* 2.8* 2.7*   No results for input(s): LIPASE, AMYLASE in the last 168 hours.  Recent Labs Lab 10/31/14 0456 11/04/14 1212  AMMONIA 42* 46*   CBC:  Recent Labs Lab 11/05/2014 2140  11/01/14 1439 11/02/14 0911 11/03/14 0358 11/04/14 0644 11/05/14 0849  WBC 9.5  < > 10.3 10.7* 10.8* 9.3 10.8*  NEUTROABS 7.5  --   --   --   --   --  8.7*  HGB 10.6*  < > 9.9* 10.5* 9.9* 10.5* 10.5*  HCT 31.8*  < > 30.4* 31.6* 31.1* 33.3* 32.6*  MCV 112.0*  < > 112.6* 111.3* 113.1* 114.4* 114.8*  PLT 228  < > 197 229 188 259 291  < > = values in  this interval not displayed. Cardiac Enzymes:  Recent Labs Lab 10/31/14 0456  CKTOTAL 28*  TROPONINI 0.13*   BNP (last 3 results) No results for input(s): BNP in the last 8760 hours.  ProBNP (last 3 results) No results for input(s): PROBNP in the last 8760 hours.  CBG:  Recent Labs Lab 10/31/14 1646  GLUCAP 88    Recent Results (from the past 240 hour(s))  MRSA PCR Screening     Status: None   Collection Time: 11/04/14  3:40 PM  Result Value Ref Range Status   MRSA by PCR NEGATIVE NEGATIVE Final    Comment:        The GeneXpert MRSA Assay (FDA approved for NASAL specimens only), is one component of a comprehensive MRSA colonization surveillance program. It is not intended to diagnose MRSA infection nor to guide or monitor treatment for MRSA infections.      Studies: Ct Head Wo Contrast  11/05/2014   CLINICAL DATA:  Decreased level of consciousness  EXAM: CT HEAD WITHOUT CONTRAST  TECHNIQUE: Contiguous axial images were obtained from the base of the skull through the vertex without intravenous contrast.  COMPARISON:  10/31/2014  FINDINGS: The bony calvarium is intact. No gross soft tissue abnormality is noted. No findings to suggest acute hemorrhage, acute infarction or space-occupying mass lesion are noted. An old right parietal infarct is again seen and stable. No other focal abnormality is noted.  IMPRESSION: Chronic right parietal infarct.  No acute abnormality seen.   Electronically Signed   By: Inez Catalina M.D.   On: 11/05/2014 12:20   Dg Chest Port 1 View  11/05/2014   CLINICAL DATA:  Pulmonary edema.  Follow-up.  EXAM: PORTABLE CHEST - 1 VIEW  COMPARISON:  11/02/2014  FINDINGS: Heart is borderline in size. Calcifications throughout the aorta. No confluent airspace opacities or effusions. No acute bony abnormality  IMPRESSION: No active disease.   Electronically Signed   By: Rolm Baptise M.D.   On: 11/05/2014 10:44   Dg Abd Portable 1v  11/05/2014   CLINICAL  DATA:  Feeding tube placement.  EXAM: PORTABLE ABDOMEN - 1 VIEW  COMPARISON:  Chest radiograph 11/05/2014  FINDINGS: Feeding tube in the left upper abdomen. Tip is likely in the stomach body region. Small amount of bowel gas in the abdomen.  IMPRESSION: Feeding tube in the gastric body region.   Electronically Signed   By: Markus Daft M.D.   On: 11/05/2014 18:57    Scheduled Meds: . albumin human  12.5 g Intravenous STAT  . anagrelide  0.5 mg Oral QODAY  . antiseptic oral rinse  7 mL Mouth Rinse q12n4p  . chlorhexidine  15 mL Mouth Rinse BID  . cinacalcet  30 mg Oral Q supper  . darbepoetin (ARANESP) injection - DIALYSIS  150 mcg Intravenous Q Fri-HD  . docusate sodium  100 mg Oral BID  . febuxostat  40 mg Oral Daily  . feeding supplement  1 Container Oral Q24H  . feeding supplement (NEPRO CARB STEADY)  237 mL Oral BID PC  . [START ON 11-10-14] ferric gluconate (FERRLECIT/NULECIT) IV  125 mg Intravenous Q Wed-HD  . finasteride  5 mg Oral Daily  . multivitamin  1 tablet Oral QHS  . neomycin-polymyxin b-dexamethasone   Left Eye QHS  . pantoprazole  40 mg Oral Daily  . sevelamer carbonate  1,600 mg Oral TID WC  . sodium chloride  3 mL Intravenous Q12H   Continuous Infusions:   Principal Problem:   Splenic laceration Active Problems:   MDS (myelodysplastic syndrome)   Chronic atrial fibrillation   ESRD on dialysis   Chronic anemia   Femur fracture, right   Acute encephalopathy   Malnutrition of moderate degree   Palliative care by specialist   Palliative care encounter    Time spent: 25 minutes/     [facility] Pager [phone]  If 7PM-7AM, please contact night-coverage at [email or web address], password [email or web address] 11/05/2014, 7:03 PM  LOS: 5 days

## 2014-11-05 NOTE — Progress Notes (Signed)
Results of ABG called to Md.  No changes at this time.  Will keep pt on BIPAP and continue to watch VS.  Brent Mcneil

## 2014-11-05 NOTE — Progress Notes (Signed)
[facility] Progress Note   Subjective: still not responding, on bipap  Filed Vitals:   11/05/14 0349 11/05/14 0414 11/05/14 0745 11/05/14 0835  BP: 103/51 97/50 103/46 103/46  Pulse: 115  116 111  Temp:   97.9 F (36.6 C)   TempSrc:   Axillary   Resp: 20 19 19 22   Height:      Weight:      SpO2: 98% 99% 100% 99%   Exam: Obtunded, grimaces to chest rub, on bipap No jvd Chest occ rhonchi at bases Irreg rhythm no MRG Abd soft ntnd no ascites RLE in brace, 1+pitting hip edema bilat 1+ pedal edema bilat Neuro is not responding to commands, obtunded  [facility] MWF  4h 61min  2/2 bath   94.5kg  Hep 2800  AVF LUA  Mircera 75 ug q 2, next 8/31 VEnofer 50/wk      Assessment: 1. AMS obtunded, no better on bipap, no sedating meds recently  2. Afib no coumadin d/t GIB"s 3. Hx polio - uses Engineer, drilling at op HD 4. MDS on anagrelide to lower plts 5. ESRD HD today 6. Vol excess/ pulm edema on admission, wts' down but prob not accurate 7. MBD cont binder 8. Anemia cont esa 9. S/p fall w splenic lac and R fem fracture  Plan - repeat CT r/o CVA, cxr f/u edema , HD today , keep BP over 100 no hep    Kelly Splinter MD  pager [phone]    cell [phone]  11/05/2014, 9:37 AM     Recent Labs Lab 11/01/14 0156 11/02/14 0911 11/03/14 0358  NA 136 134* 134*  K 4.3 4.4 4.4  CL 97* 94* 95*  CO2 28 28 28   GLUCOSE 89 90 92  BUN 17 26* 17  CREATININE 2.66* 3.80* 2.59*  CALCIUM 8.9 9.0 8.9  PHOS 4.2 5.5* 4.5    Recent Labs Lab 10/31/14 0456 11/01/14 0156 11/02/14 0911 11/03/14 0358  AST 25  --  26 31  ALT 9*  --  10* 11*  ALKPHOS 124  --  106 107  BILITOT 1.2  --  1.3* 1.0  PROT 7.9  --  6.9 7.1  ALBUMIN 3.0* 2.8* 2.8* 2.7*    Recent Labs Lab 11/08/2014 2140  11/03/14 0358 11/04/14 0644 11/05/14 0849  WBC 9.5  < > 10.8* 9.3 10.8*  NEUTROABS 7.5  --   --   --  PENDING  HGB 10.6*  < > 9.9* 10.5* 10.5*  HCT 31.8*  < > 31.1* 33.3* 32.6*  MCV 112.0*  < >  113.1* 114.4* 114.8*  PLT 228  < > 188 259 291  < > = values in this interval not displayed. Marland Kitchen acyclovir  800 mg Oral Once per day on Mon Wed Fri Sat  . anagrelide  0.5 mg Oral QODAY  . antiseptic oral rinse  7 mL Mouth Rinse q12n4p  . chlorhexidine  15 mL Mouth Rinse BID  . cinacalcet  30 mg Oral Q supper  . darbepoetin (ARANESP) injection - DIALYSIS  150 mcg Intravenous Q Fri-HD  . docusate sodium  100 mg Oral BID  . febuxostat  40 mg Oral Daily  . feeding supplement  1 Container Oral Q24H  . feeding supplement (NEPRO CARB STEADY)  237 mL Oral BID PC  . [START ON 11/13/2014] ferric gluconate (FERRLECIT/NULECIT) IV  125 mg Intravenous Q Wed-HD  . finasteride  5 mg Oral Daily  . multivitamin  1 tablet Oral QHS  .  neomycin-polymyxin b-dexamethasone   Left Eye QHS  . pantoprazole  40 mg Oral Daily  . sevelamer carbonate  1,600 mg Oral TID WC  . sodium chloride  3 mL Intravenous Q12H     acetaminophen **OR** acetaminophen, levalbuterol, loratadine, ondansetron **OR** ondansetron (ZOFRAN) IV

## 2014-11-05 NOTE — Consult Note (Signed)
[facility]OS[facility]ITALIST CONSULT NOTE   Referring physician: AKULA   Reason for Consult: AMS  HPI:                                                                                                                                          Brent Mcneil is an 81 y.o. male with history of ESRD on dialysis, chronic Afib not on AC due to AVM and GI bleed. per the patient's daughter patient slid out of his motorized wheelchair and got tangled in between the wheelchair and the cabinet. Patient did not lose consciousness. On exam in the ER patient has some contusion on his chest and CT abdomen shows possible laceration of the spleen and also fracture of the right femur distal supracondylar area. MRI brain on admission was negative for CVA. While in hospital patient has remained drowsy if not obtunded. On 8/21 it was noted his CO2 was 61.5.  Patient was placed on Bi-pap and transferred to step down. In addition on 8/21 patient dropped his BP to 85/39.       Past Medical History  Diagnosis Date  . Chronic kidney disease     HD- M-W-F  . End stage renal disease   . Uremic encephalopathy     Metabolic  . Anemia   . Gastrointestinal bleed   . Ulcer     Peptic and gastric   . Ulcerative esophagitis   . Myeloproliferative disorder   . Macular degeneration 1990s    bilateral  . History of thrombocytosis   . Myocardial infarction 1996  . Polio age 9  . Pneumonia   . Stroke     approx 6-7 years ago, affected speech for a short time  . Atrial flutter   . Atrial fibrillation 08/2012    treated with Carvedilol, Echo done 09/22/12  . Laceration of spleen 10/17/2014  . Fracture closed, femur, shaft 10/2014    RT     Past Surgical History  Procedure Laterality Date  . Colon resection  09-2006  . Tonsillectomy  1958  . Orthopedic surgeries  as a child    due to polio  . Cataract surgery Right 2004  . Colon surgery      with Colostomy  . Av fistula placement Left 08/18/2012     Procedure: ARTERIOVENOUS (AV) FISTULA CREATION;  Surgeon: Serafina Mitchell, MD;  Location: [facility];  Service: Vascular;  Laterality: Left;  Marland Kitchen Eye surgery Right     cataract  . Cardiac catheterization  1996  . Av fistula placement Left 10/13/2012    Procedure: ARTERIOVENOUS (AV) FISTULA CREATION;  Surgeon: Serafina Mitchell, MD;  Location: [facility];  Service: Vascular;  Laterality: Left;    Family History  Problem Relation Age of Onset  .  Cancer Father   . Lymphoma Brother   . Lung cancer Sister   . Emphysema Sister      Social History:  reports that he quit smoking about 58 years ago. He has quit using smokeless tobacco. His smokeless tobacco use included Chew. He reports that he does not drink alcohol or use illicit drugs.  Allergies  Allergen Reactions  . Lubiprostone Diarrhea    MEDICATIONS:                                                                                                                     Prior to Admission:  Prescriptions prior to admission  Medication Sig Dispense Refill Last Dose  . acetaminophen (TYLENOL) 500 MG tablet Take 1,000 mg by mouth every 6 (six) hours as needed for fever (pain).   unknown  . acyclovir (ZOVIRAX) 800 MG tablet Take 800 mg by mouth See admin instructions. On Monday, Wednesday, Friday and Saturday at bedtime   10/29/2014 at Unknown time  . anagrelide (AGRYLIN) 0.5 MG capsule Take 0.5 mg by mouth every other day.   10/29/2014 at Unknown time  . B Complex-C-Folic Acid (FULL SPECTRUM B/VITAMIN C PO) Take 1 tablet by mouth daily.   10/23/2014 at Unknown time  . beta carotene 25000 UNIT capsule Take 25,000 Units by mouth daily.    10/19/2014 at Unknown time  . carvedilol (COREG) 6.25 MG tablet Take 6.25 mg by mouth See admin instructions. Take 1 tablet (6.25 mg) twice daily, except: hold morning dose on Monday, Wednesday and Friday if SBP<110   10/16/2014 at 1000  . cinacalcet (SENSIPAR) 30 MG tablet Take 30 mg by mouth daily with supper.    11/08/2014 at  Unknown time  . diphenhydrAMINE (BENADRYL) 12.5 MG/5ML liquid Take 12.5 mg by mouth every 6 (six) hours as needed for itching.   unknown  . docusate sodium (COLACE) 100 MG capsule Take 100 mg by mouth 2 (two) times daily.   10/27/2014 at Unknown time  . febuxostat (ULORIC) 40 MG tablet Take 40 mg by mouth daily.   10/20/2014 at Unknown time  . finasteride (PROSCAR) 5 MG tablet Take 5 mg by mouth daily.    11/06/2014 at Unknown time  . ketoconazole (NIZORAL) 2 % shampoo Apply 1 application topically daily.   10/29/2014 at Unknown time  . loratadine (CLARITIN) 10 MG tablet Take 10 mg by mouth daily as needed for allergies.   unknown  . metoCLOPramide (REGLAN) 5 MG tablet Take 5 mg by mouth 4 (four) times daily -  before meals and at bedtime.   10/19/2014 at Unknown time  . miconazole (BAZA ANTIFUNGAL) 2 % cream Apply 1 application topically as needed.   unknown  . omeprazole (PRILOSEC) 20 MG capsule Take 40 mg by mouth daily.   10/28/2014 at Unknown time  . ondansetron (ZOFRAN) 4 MG tablet Take 4 mg by mouth every 4 (four) hours as needed for vomiting.   unknown  . Oxycodone HCl 10 MG TABS Take 10  mg by mouth 2 (two) times daily as needed (for pain control).   10/15/2014 at Unknown time  . polyvinyl alcohol (LIQUIFILM TEARS) 1.4 % ophthalmic solution Place 1 drop into both eyes every 2 (two) hours as needed for dry eyes.   unknown  . Povidone-Iodine 10 % MISC Apply topically daily. To 5th MTP ulcer and 4th and 5th toe ulcers   10/29/2014 at Unknown time  . prednisoLONE acetate (PRED FORTE) 1 % ophthalmic suspension Place 1 drop into the left eye 2 (two) times daily.   10/29/2014 at Unknown time  . sevelamer carbonate (RENVELA) 800 MG tablet Take 1,600 mg by mouth 3 (three) times daily with meals.   11/11/2014 at Unknown time  . SIMPLY SALINE NA Apply topically daily. To clean wounds on the top of right foot and right 4th toe   11/10/2014 at Unknown time  . traMADol (ULTRAM) 50 MG tablet Take 50 mg by mouth every  6 (six) hours as needed (pain).    Past Week at Unknown time  . acetaminophen (TYLENOL) 325 MG tablet Take 1 tablet (325 mg total) by mouth every 6 (six) hours as needed for moderate pain (take with oxycodone until percocet arrives.  Do not take with percocet). (Patient not taking: Reported on 04/01/2014) 30 tablet 0 Not Taking at Unknown time   Scheduled: . anagrelide  0.5 mg Oral QODAY  . antiseptic oral rinse  7 mL Mouth Rinse q12n4p  . chlorhexidine  15 mL Mouth Rinse BID  . cinacalcet  30 mg Oral Q supper  . darbepoetin (ARANESP) injection - DIALYSIS  150 mcg Intravenous Q Fri-HD  . docusate sodium  100 mg Oral BID  . febuxostat  40 mg Oral Daily  . feeding supplement  1 Container Oral Q24H  . feeding supplement (NEPRO CARB STEADY)  237 mL Oral BID PC  . [START ON Dec 06, 2014] ferric gluconate (FERRLECIT/NULECIT) IV  125 mg Intravenous Q Wed-HD  . finasteride  5 mg Oral Daily  . multivitamin  1 tablet Oral QHS  . neomycin-polymyxin b-dexamethasone   Left Eye QHS  . pantoprazole  40 mg Oral Daily  . sevelamer carbonate  1,600 mg Oral TID WC  . sodium chloride  3 mL Intravenous Q12H     ROS:                                                                                                                                       History obtained from unobtainable from patient due to mental status    Blood pressure 101/49, pulse 113, temperature 98.3 F (36.8 C), temperature source Axillary, resp. rate 23, height 6' (1.829 m), weight 94.8 kg (208 lb 15.9 oz), SpO2 100 %.   Neurologic Examination:  HEENT-  Normocephalic, no lesions, without obvious abnormality.  Normal external eye and conjunctiva.  Normal TM's bilaterally.  Normal auditory canals and external ears. Normal external nose, mucus membranes and septum.  Normal pharynx. Cardiovascular- S1, S2 normal, pulses palpable throughout    Lungs- chest clear, no wheezing, rales, normal symmetric air entry Abdomen- normal findings: bowel sounds normal Extremities- no edema Lymph-no adenopathy palpable Musculoskeletal-no joint tenderness, deformity or swelling Skin-warm and dry, no hyperpigmentation, vitiligo, or suspicious lesions  Neurological Examination Mental Status: Patient does not respond to verbal stimuli.  Does respond to deep sternal rub by grimacing face.  Does not follow commands.  No verbalizations noted.  Cranial Nerves: II: patient does not respond confrontation bilaterally, pupils right 2 mm, left 2 mm,and equal bilaterally III,IV,VI: doll's response present bilaterally.  V,VII: corneal reflex present bilaterally --right face seems to move more than left with facial grimace VIII: patient does not respond to verbal stimuli IX,X: gag reflex present, XI: trapezius strength unable to test bilaterally XII: tongue strength unable to test Motor: Extremities flaccid throughout.  No spontaneous movement noted.  No purposeful movements noted. Sensory: limited respond to noxious stimuli in any extremity. Deep Tendon Reflexes:  Absent throughout. Plantars: absent bilaterally Cerebellar: Unable to perform       Lab Results: Basic Metabolic Panel:  Recent Labs Lab 10/31/14 0456 11/01/14 0156 11/02/14 0911 11/03/14 0358 11/05/14 0849  NA 132* 136 134* 134* 137  K 3.7 4.3 4.4 4.4 4.8  CL 92* 97* 94* 95* 97*  CO2 27 28 28 28 24   GLUCOSE 104* 89 90 92 66  BUN 32* 17 26* 17 36*  CREATININE 3.92* 2.66* 3.80* 2.59* 4.71*  CALCIUM 8.5* 8.9 9.0 8.9 9.1  PHOS  --  4.2 5.5* 4.5  --     Liver Function Tests:  Recent Labs Lab 10/29/2014 2140 10/31/14 0456 11/01/14 0156 11/02/14 0911 11/03/14 0358  AST 26 25  --  26 31  ALT 9* 9*  --  10* 11*  ALKPHOS 128* 124  --  106 107  BILITOT 0.9 1.2  --  1.3* 1.0  PROT 7.9 7.9  --  6.9 7.1  ALBUMIN 2.9* 3.0* 2.8* 2.8* 2.7*   No results for input(s):  LIPASE, AMYLASE in the last 168 hours.  Recent Labs Lab 10/31/14 0456 11/04/14 1212  AMMONIA 42* 46*    CBC:  Recent Labs Lab 10/27/2014 2140  11/01/14 1439 11/02/14 0911 11/03/14 0358 11/04/14 0644 11/05/14 0849  WBC 9.5  < > 10.3 10.7* 10.8* 9.3 10.8*  NEUTROABS 7.5  --   --   --   --   --  8.7*  HGB 10.6*  < > 9.9* 10.5* 9.9* 10.5* 10.5*  HCT 31.8*  < > 30.4* 31.6* 31.1* 33.3* 32.6*  MCV 112.0*  < > 112.6* 111.3* 113.1* 114.4* 114.8*  PLT 228  < > 197 229 188 259 291  < > = values in this interval not displayed.  Cardiac Enzymes:  Recent Labs Lab 10/31/14 0456  CKTOTAL 28*  TROPONINI 0.13*    Lipid Panel: No results for input(s): CHOL, TRIG, HDL, CHOLHDL, VLDL, LDLCALC in the last 168 hours.  CBG:  Recent Labs Lab 10/31/14 6734  LPFXTK 24    Microbiology: Results for orders placed or performed during the hospital encounter of 11/01/2014  MRSA PCR Screening     Status: None   Collection Time: 11/04/14  3:40 PM  Result Value Ref Range Status   MRSA by  PCR NEGATIVE NEGATIVE Final    Comment:        The GeneXpert MRSA Assay (FDA approved for NASAL specimens only), is one component of a comprehensive MRSA colonization surveillance program. It is not intended to diagnose MRSA infection nor to guide or monitor treatment for MRSA infections.     Coagulation Studies: No results for input(s): LABPROT, INR in the last 72 hours.  Imaging: Ct Head Wo Contrast  11/05/2014   CLINICAL DATA:  Decreased level of consciousness  EXAM: CT HEAD WITHOUT CONTRAST  TECHNIQUE: Contiguous axial images were obtained from the base of the skull through the vertex without intravenous contrast.  COMPARISON:  10/31/2014  FINDINGS: The bony calvarium is intact. No gross soft tissue abnormality is noted. No findings to suggest acute hemorrhage, acute infarction or space-occupying mass lesion are noted. An old right parietal infarct is again seen and stable. No other focal  abnormality is noted.  IMPRESSION: Chronic right parietal infarct.  No acute abnormality seen.   Electronically Signed   By: Inez Catalina M.D.   On: 11/05/2014 12:20   Dg Chest Port 1 View  11/05/2014   CLINICAL DATA:  Pulmonary edema.  Follow-up.  EXAM: PORTABLE CHEST - 1 VIEW  COMPARISON:  11/02/2014  FINDINGS: Heart is borderline in size. Calcifications throughout the aorta. No confluent airspace opacities or effusions. No acute bony abnormality  IMPRESSION: No active disease.   Electronically Signed   By: Rolm Baptise M.D.   On: 11/05/2014 10:44       Assessment and plan per attending neurologist  Etta Quill PA-C [facility] [phone]  11/05/2014, 12:36 PM   Assessment/Plan: 81 yo M with progressive decline in mental status. I suspect this represents a multifactorial hypoactive delirium. His ammonia is slightly high, though not sure if this is responsible for his mental status changes. He does have a high ferritin and mildly elevated white count suggestive of possible inflammation. I do wonder if this is contributory to his delirium. Certainly hypoxia, acidosis, hypercarbia all would contribute as well.    1) a.m. Cortisol 2) EEG 3) neurology will continue to follow   Roland Rack, MD [facility] [phone]  If 7pm- 7am, please page neurology on call as listed in [place].

## 2014-11-05 NOTE — Progress Notes (Signed)
SLP Cancellation Note  Patient Details Name: Brent Mcneil MRN: [id number] DOB: 1923/10/20   Cancelled treatment:        On Bipap and lethargy. Will continue efforts.   [name] 11/05/2014, 3:52 PM   [name].Ed [employer] [phone]

## 2014-11-06 ENCOUNTER — Ambulatory Visit (HOSPITAL_BASED_OUTPATIENT_CLINIC_OR_DEPARTMENT_OTHER): Payer: Medicare Other

## 2014-11-06 ENCOUNTER — Inpatient Hospital Stay (HOSPITAL_COMMUNITY): Payer: Medicare Other

## 2014-11-06 DIAGNOSIS — R410 Disorientation, unspecified: Secondary | ICD-10-CM

## 2014-11-06 DIAGNOSIS — L8990 Pressure ulcer of unspecified site, unspecified stage: Secondary | ICD-10-CM | POA: Insufficient documentation

## 2014-11-06 DIAGNOSIS — I10 Essential (primary) hypertension: Secondary | ICD-10-CM

## 2014-11-06 DIAGNOSIS — R0902 Hypoxemia: Secondary | ICD-10-CM

## 2014-11-06 LAB — BLOOD GAS, ARTERIAL
Acid-base deficit: 3.3 mmol/L — ABNORMAL HIGH (ref 0.0–2.0)
Bicarbonate: 21.7 mEq/L (ref 20.0–24.0)
Delivery systems: POSITIVE
Drawn by: 313941
EXPIRATORY PAP: 6
FIO2: 0.3
INSPIRATORY PAP: 14
O2 Saturation: 97.4 %
PH ART: 7.33 — AB (ref 7.350–7.450)
Patient temperature: 98.6
TCO2: 23 mmol/L (ref 0–100)
pCO2 arterial: 42.3 mmHg (ref 35.0–45.0)
pO2, Arterial: 90.4 mmHg (ref 80.0–100.0)

## 2014-11-06 LAB — CBC
HCT: 31.9 % — ABNORMAL LOW (ref 39.0–52.0)
HEMOGLOBIN: 10.3 g/dL — AB (ref 13.0–17.0)
MCH: 37.3 pg — ABNORMAL HIGH (ref 26.0–34.0)
MCHC: 32.3 g/dL (ref 30.0–36.0)
MCV: 115.6 fL — ABNORMAL HIGH (ref 78.0–100.0)
Platelets: 212 10*3/uL (ref 150–400)
RBC: 2.76 MIL/uL — ABNORMAL LOW (ref 4.22–5.81)
RDW: 15.1 % (ref 11.5–15.5)
WBC: 13.1 10*3/uL — AB (ref 4.0–10.5)

## 2014-11-06 LAB — RENAL FUNCTION PANEL
ALBUMIN: 2.8 g/dL — AB (ref 3.5–5.0)
ANION GAP: 16 — AB (ref 5–15)
BUN: 16 mg/dL (ref 6–20)
CALCIUM: 8.7 mg/dL — AB (ref 8.9–10.3)
CO2: 25 mmol/L (ref 22–32)
Chloride: 96 mmol/L — ABNORMAL LOW (ref 101–111)
Creatinine, Ser: 3.07 mg/dL — ABNORMAL HIGH (ref 0.61–1.24)
GFR calc Af Amer: 19 mL/min — ABNORMAL LOW (ref >60)
GFR, EST NON AFRICAN AMERICAN: 17 mL/min — AB (ref >60)
GLUCOSE: 75 mg/dL (ref 65–99)
PHOSPHORUS: 4 mg/dL (ref 2.5–4.6)
POTASSIUM: 3.7 mmol/L (ref 3.5–5.1)
SODIUM: 137 mmol/L (ref 135–145)

## 2014-11-06 LAB — PROCALCITONIN: PROCALCITONIN: 0.69 ng/mL

## 2014-11-06 LAB — CORTISOL-AM, BLOOD: Cortisol - AM: 18.2 ug/dL (ref 6.7–22.6)

## 2014-11-06 LAB — LACTIC ACID, PLASMA: LACTIC ACID, VENOUS: 1.1 mmol/L (ref 0.5–2.0)

## 2014-11-06 MED ORDER — SODIUM CHLORIDE 0.9 % IV BOLUS (SEPSIS)
250.0000 mL | Freq: Once | INTRAVENOUS | Status: AC
  Administered 2014-11-06: 250 mL via INTRAVENOUS

## 2014-11-06 MED ORDER — HYDROMORPHONE HCL 1 MG/ML IJ SOLN: 0.5000 mg | INTRAMUSCULAR | Status: DC | PRN

## 2014-11-06 MED ORDER — LORAZEPAM 2 MG/ML IJ SOLN
1.0000 mg | INTRAMUSCULAR | Status: DC | PRN
  Administered 2014-11-06 – 2014-11-07 (×2): 1 mg via INTRAVENOUS
  Filled 2014-11-06 (×2): qty 1

## 2014-11-06 MED ORDER — PROCHLORPERAZINE MALEATE 10 MG PO TABS
10.0000 mg | ORAL_TABLET | Freq: Four times a day (QID) | ORAL | Status: DC | PRN
  Filled 2014-11-06: qty 1

## 2014-11-06 MED ORDER — SODIUM CHLORIDE 0.9 % IV SOLN: 100.0000 mL | INTRAVENOUS | Status: DC | PRN

## 2014-11-06 MED ORDER — LORAZEPAM 2 MG/ML PO CONC: 1.0000 mg | ORAL | Status: DC | PRN

## 2014-11-06 MED ORDER — SODIUM CHLORIDE 0.9 % IV BOLUS (SEPSIS)
500.0000 mL | Freq: Once | INTRAVENOUS | Status: AC
  Administered 2014-11-06: 500 mL via INTRAVENOUS

## 2014-11-06 MED ORDER — HEPARIN SODIUM (PORCINE) 1000 UNIT/ML DIALYSIS: 1800.0000 [IU] | Freq: Once | INTRAMUSCULAR | Status: DC

## 2014-11-06 MED ORDER — BISACODYL 10 MG RE SUPP: 10.0000 mg | Freq: Every day | RECTAL | Status: DC | PRN

## 2014-11-06 MED ORDER — PROCHLORPERAZINE EDISYLATE 5 MG/ML IJ SOLN
10.0000 mg | Freq: Four times a day (QID) | INTRAMUSCULAR | Status: DC | PRN
  Filled 2014-11-06: qty 2

## 2014-11-06 MED ORDER — SODIUM CHLORIDE 0.9 % IJ SOLN
3.0000 mL | Freq: Two times a day (BID) | INTRAMUSCULAR | Status: DC
  Administered 2014-11-06: 3 mL via INTRAVENOUS

## 2014-11-06 MED ORDER — NEPRO/CARBSTEADY PO LIQD
1000.0000 mL | ORAL | Status: DC
  Filled 2014-11-06 (×2): qty 1000

## 2014-11-06 MED ORDER — SODIUM CHLORIDE 0.9 % IJ SOLN: 3.0000 mL | INTRAMUSCULAR | Status: DC | PRN

## 2014-11-06 MED ORDER — LIDOCAINE-PRILOCAINE 2.5-2.5 % EX CREA
1.0000 "application " | TOPICAL_CREAM | CUTANEOUS | Status: DC | PRN
  Filled 2014-11-06: qty 5

## 2014-11-06 MED ORDER — GLYCOPYRROLATE 0.2 MG/ML IJ SOLN
0.2000 mg | INTRAMUSCULAR | Status: DC | PRN
  Filled 2014-11-06: qty 1

## 2014-11-06 MED ORDER — LIDOCAINE HCL (PF) 1 % IJ SOLN: 5.0000 mL | INTRAMUSCULAR | Status: DC | PRN

## 2014-11-06 MED ORDER — HEPARIN SODIUM (PORCINE) 1000 UNIT/ML DIALYSIS: 1000.0000 [IU] | INTRAMUSCULAR | Status: DC | PRN

## 2014-11-06 MED ORDER — HALOPERIDOL LACTATE 2 MG/ML PO CONC
0.5000 mg | ORAL | Status: DC | PRN
  Filled 2014-11-06: qty 0.3

## 2014-11-06 MED ORDER — HALOPERIDOL LACTATE 5 MG/ML IJ SOLN: 0.5000 mg | INTRAMUSCULAR | Status: DC | PRN

## 2014-11-06 MED ORDER — GLYCOPYRROLATE 1 MG PO TABS
1.0000 mg | ORAL_TABLET | ORAL | Status: DC | PRN
  Filled 2014-11-06: qty 1

## 2014-11-06 MED ORDER — PROCHLORPERAZINE 25 MG RE SUPP
25.0000 mg | Freq: Two times a day (BID) | RECTAL | Status: DC | PRN
  Filled 2014-11-06: qty 1

## 2014-11-06 MED ORDER — NEPRO/CARBSTEADY PO LIQD
237.0000 mL | ORAL | Status: DC | PRN
  Filled 2014-11-06: qty 237

## 2014-11-06 MED ORDER — HALOPERIDOL 1 MG PO TABS
0.5000 mg | ORAL_TABLET | ORAL | Status: DC | PRN
  Filled 2014-11-06: qty 1

## 2014-11-06 MED ORDER — SODIUM CHLORIDE 0.9 % IV SOLN: 250.0000 mL | INTRAVENOUS | Status: DC | PRN

## 2014-11-06 MED ORDER — LORAZEPAM 1 MG PO TABS: 1.0000 mg | ORAL_TABLET | ORAL | Status: DC | PRN

## 2014-11-06 MED ORDER — ALTEPLASE 2 MG IJ SOLR
2.0000 mg | Freq: Once | INTRAMUSCULAR | Status: DC | PRN
  Filled 2014-11-06: qty 2

## 2014-11-06 MED ORDER — PENTAFLUOROPROP-TETRAFLUOROETH EX AERO: 1.0000 "application " | INHALATION_SPRAY | CUTANEOUS | Status: DC | PRN

## 2014-11-06 NOTE — Progress Notes (Signed)
Brief Nutrition Follow-Up Note  Addendum for follow-up assessment: Spoke with MD and RN. MD would like to hold TF until MD has spoken with family today. RD will d/c orders and re-renter as indicated. Plan discussed with RN.  If family would like to pursue TF, recommend:   Initiate Nepro @ 20 ml/hr via Panda and increase by 10 ml every 4 hours to goal rate of 55 ml/hr.   Tube feeding regimen provides 2376 kcal (100% of needs), 106 grams of protein, and 960 ml of H2O.   RN reports she will contact RD if TF is to be started.   RD will continue to follow.   Kennley Schwandt A. Jimmye Norman, RD, LDN, CDE Pager: [phone] After hours Pager: [phone]

## 2014-11-06 NOTE — Progress Notes (Signed)
EEG Completed; Results Pending  

## 2014-11-06 NOTE — Progress Notes (Signed)
Nutrition Follow-up  DOCUMENTATION CODES:   Non-severe (moderate) malnutrition in context of chronic illness  INTERVENTION:   Initiate Nepro @ 20 ml/hr via Panda and increase by 10 ml every 4 hours to goal rate of 55 ml/hr.   Tube feeding regimen provides 2376 kcal (100% of needs), 106 grams of protein, and 960 ml of H2O.   NUTRITION DIAGNOSIS:   Inadequate oral intake related to lethargy/confusion, chronic illness, poor appetite as evidenced by meal completion < 50%, severe depletion of muscle mass, moderate depletion of body fat.  Ongoing  GOAL:   Patient will meet greater than or equal to 90% of their needs  Unmet  MONITOR:   PO intake, Supplement acceptance, Weight trends, Labs, I & O's  REASON FOR ASSESSMENT:   Consult Enteral/tube feeding initiation and management  ASSESSMENT:   81 y.o. male with history of ESRD on hemodialysis on Monday Wednesday and Friday, chronic atrial fibrillation not on anticoagulation secondary to AVMs, history of GI bleed secondary to AVMs, myelodysplastic syndrome on anagrelide, chronic anemia was brought to the ER to patient had a fall at his living facility. Pt with right femur fracture and possible splenic laceratuib.  Pt lethargic, on continuous Bi-Pap currently. No family at bedside.   Pt was transferred to SDU on 11/04/14 due to medical decline and need for continuous Bi-Pap.   SLP signed off on 11/05/14, due to medical decline. Recommending NPO status per notes from 11/03/14 and 11/04/14. Pt with poor PO intake prior to NPO status (25%).   Palliaitve care following. Per chart review, family meeting scheduled today at 5 PM today to discuss pt progress.   Pt with LLQ colostomy. Noted colostomy "burped" on 11/05/14 per RN documentation. Last documented BM on 11/02/14.   Spoke with RN, who confirms that plan is for TF. RN reports pt family is very concerned about pt's nutritional status. Pt with Panda tube; per CXR on 11/05/14 feeding tube  tip in the stomach (gastric body region).   Labs reviewed.  Diet Order:  Diet NPO time specified Except for: Other (See Comments)  Skin:  Wound (see comment) (DTI buttocks)  Last BM:  8/19  Height:   Ht Readings from Last 1 Encounters:  11/02/14 6' (1.829 m)    Weight:   Wt Readings from Last 1 Encounters:  11/06/14 211 lb (95.709 kg)    Ideal Body Weight:  80.9 kg  BMI:  Body mass index is 28.61 kg/(m^2).  Estimated Nutritional Needs:   Kcal:  8756-4332  Protein:  90-100 grams  Fluid:  per MD  EDUCATION NEEDS:   No education needs identified at this time  Lynae Pederson A. Jimmye Norman, RD, LDN, CDE Pager: [phone] After hours Pager: [phone]

## 2014-11-06 NOTE — Progress Notes (Signed)
Daily Progress Note   Patient Name: Brent Mcneil       Date: 11/06/2014 DOB: August 24, 1923  Age: 81 y.o. MRN#: [id number] Attending Physician: Hosie Poisson, MD Primary Care Physician: Bonnita Nasuti, MD Admit Date: 10/19/2014  Reason for Consultation/Follow-up: Establishing goals of care  Subjective: Brent Mcneil is an 81 y.o. male with history of polio as a child, ESRD on dialysis, chronic Afib not on AC due to AVM and GI bleed who slid out of his motorized wheelchair and got wedged in between the wheelchair and the cabinet at the assisted living facility where he resides. Patient did not lose consciousness. CT abdomen revealed possible laceration of the spleen and fracture of the right femur distal supracondylar area. MRI brain on admission was negative for CVA. He became very drowsy following admission was noted to have CO2 greater than 60. Patient was placed on Bi-pap and transferred to step down. Palliative was consulted for goals of care moving forward in light of his acute injuries on top of chronic medical problems  Interval Events: I met for a family meeting with the patient's children, wife, and grandchildren. We discussed his continued decline including hypotension despite fluid resuscitation, underlying cardiovascular disease, hypoxia with pulmonary hypertension noted on echo, splenic laceration, and altered mental status without clear correctable etiology.   They understand the gravity of the situation and that he is transitioning into active phases of dying. Based upon this, goals of care moving forward are to ensure his comfort.  We discussed this at length, and family is agreeable to plan to discontinue interventions that are not focused on his comfort while also implementing a good comfort based medication regimen. They are in agreement that he would be best served by transitioning to another floor outside the ICU.  We discussed that it would depend upon his course overnight  to determine if he could possibly be moved from the hospital to someplace such as a residential hospice facility.  I answered all relevant questions and patient's family reports appreciation for care that he has received while here at the hospital.  Length of Stay: 6 days  Current Medications: Scheduled Meds:  . antiseptic oral rinse  7 mL Mouth Rinse q12n4p  . chlorhexidine  15 mL Mouth Rinse BID  . heparin  1,800 Units Dialysis Once in dialysis  . sodium chloride  250 mL Intravenous Once  . sodium chloride  3 mL Intravenous Q12H  . sodium chloride  3 mL Intravenous Q12H    Continuous Infusions:    PRN Meds: sodium chloride, sodium chloride, sodium chloride, acetaminophen **OR** acetaminophen, alteplase, bisacodyl, feeding supplement (NEPRO CARB STEADY), glycopyrrolate **OR** glycopyrrolate **OR** glycopyrrolate, haloperidol **OR** haloperidol **OR** haloperidol lactate, heparin, HYDROmorphone (DILAUDID) injection, levalbuterol, lidocaine (PF), lidocaine-prilocaine, LORazepam **OR** LORazepam **OR** LORazepam, ondansetron **OR** ondansetron (ZOFRAN) IV, pentafluoroprop-tetrafluoroeth, prochlorperazine **OR** prochlorperazine **OR** prochlorperazine, sodium chloride  Palliative Performance Scale: 10%     Vital Signs: BP 75/41 mmHg  Pulse 119  Temp(Src) 98.8 F (37.1 C) (Axillary)  Resp 10  Ht 6' (1.829 m)  Wt 99.292 kg (218 lb 14.4 oz)  BMI 29.68 kg/m2  SpO2 99% SpO2: SpO2: 99 % O2 Device: O2 Device: Nasal Cannula O2 Flow Rate: O2 Flow Rate (L/min): 4.5 L/min  Intake/output summary:  Intake/Output Summary (Last 24 hours) at 11/06/14 2131 Last data filed at 11/06/14 1800  Gross per 24 hour  Intake    250 ml  Output      0 ml  Net  250 ml   LBM:   Baseline Weight: Weight: 97.3 kg (214 lb 8.1 oz) Most recent weight: Weight: 99.292 kg (218 lb 14.4 oz)  Physical Exam:  General: Somnolent, appears comfortable  Cardiovascular: s1s2 tachycardia  Respiratory: ctab,  diminished at bases. Shallow respiration.   Abdomen: soft nontender, bowel sounds appreciated Musculoskeletal: brace noted on left leg.         Additional Data Reviewed: Recent Labs     11/05/14  0849  11/05/14  2330  WBC  10.8*  13.1*  HGB  10.5*  10.3*  PLT  291  212  NA  137  137  BUN  36*  16  CREATININE  4.71*  3.07*     Problem List:  Patient Active Problem List   Diagnosis Date Noted  . Pressure ulcer 11/06/2014  . Palliative care by specialist   . Palliative care encounter   . Malnutrition of moderate degree 11/02/2014  . Splenic laceration 10/31/2014  . Chronic anemia 10/31/2014  . Femur fracture, right 10/31/2014  . Acute encephalopathy 10/31/2014  . Absolute anemia   . Bleeding gastrointestinal   . GI bleeding 04/01/2014  . Chronic atrial fibrillation 04/01/2014  . ESRD on dialysis 04/01/2014  . Colostomy in place 04/01/2014  . GI bleed 04/01/2014  . Atrial fibrillation with RVR 02/09/2013  . SIRS (systemic inflammatory response syndrome) 02/09/2013  . ESRD (end stage renal disease) 02/09/2013  . History of arteriovenous malformation (AVM) 02/09/2013  . MDS (myelodysplastic syndrome) 02/09/2013  . End stage renal disease 07/26/2012     Palliative Care Assessment & Plan    Code Status:  DNR  Goals of Care: With his acute decline, family would like to focus on comfort care. We will try to transition him to a bed on 6 N. this evening. If he is stable enough for transfer, family may be interested in trying to transition him from the hospital to residential hospice facility tomorrow. This will need to be assessed based upon his course overnight, but I feel this is likely a terminal admission.   Symptom Management:  End-of-life order set initiated for symptomatic management  Prognosis: Hours - Days Discharge Planning: This will likely be a terminal admission   Care plan was discussed with patient's family, bedside nurse, attending Dr.  Karleen Hampshire  Thank you for allowing the Palliative Medicine Team to assist in the care of this patient.   Time In: 1700 Time Out: 1830 Total Time 90 Prolonged Time Billed  yes     Greater than 50%  of this time was spent counseling and coordinating care related to the above assessment and plan.   Micheline Rough, MD  11/06/2014, 9:31 PM  Please contact Palliative Medicine Team phone at [phone] for questions and concerns.

## 2014-11-06 NOTE — Progress Notes (Signed)
  Echocardiogram 2D Echocardiogram has been performed.  Brent Mcneil 11/06/2014, 2:27 PM

## 2014-11-06 NOTE — Progress Notes (Signed)
Subjective: No significant change  Exam: Filed Vitals:   11/06/14 0853  BP: 81/32  Pulse: 118  Temp:   Resp: 27   Gen: In bed, on bipap Resp: using bipap Abd: soft, nt  Neuro: MS: opens eyes to noxious sitmuli, does nto follow commands.  FK:CLEXN pupil slightly smaller than left,  Motor: moves bilateral upper extremities to noxious stimuli.  Sensory:as above  Pertinent Labs: Am cortisol 18  Impression: 81 yo M with suspected hypoactive delirium. His MS change does seem out of proportion to his medical problems, but at 4 I still feel this is the most likley cause. He does have an increasing WBC.   Recommendations: 1) EEG 2) repeat MRI   Roland Rack, MD [facility] [phone]  If 7pm- 7am, please page neurology on call as listed in [place].

## 2014-11-06 NOTE — Progress Notes (Signed)
[facility] Progress Note   Subjective: still not responding, on bipap  Filed Vitals:   11/06/14 0700 11/06/14 0853 11/06/14 1000 11/06/14 1117  BP: 89/38 81/32 82/38  83/35  Pulse:  118    Temp:  99.7 F (37.6 C)  99 F (37.2 C)  TempSrc:  Axillary  Axillary  Resp: 28 27 21 22   Height:      Weight:      SpO2: 97% 95% 96% 98%   Exam: Obtunded, on bipap, unresponsive No jvd Chest occ rhonchi at bases Irreg rhythm no MRG Abd soft ntnd no ascites RLE in brace, 1-2+ leg edema bilat, 2+ UE edema L> R Neuro is not responding to commands, obtunded  [place] MWF  4h 19min  2/2 bath   94.5kg  Hep 2800  AVF LUA  Mircera 75 ug q 2, next 8/31 VEnofer 50/wk      Assessment: 1. AMS obtunded, no better on bipap, no sedating meds recently  2. Afib no coumadin d/t GIB"s 3. Hx polio - uses Engineer, drilling at op HD 4. MDS on anagrelide to lower plts 5. ESRD HD Wed 6. Pulm edema - resolved by CXR yest 7. MBD cont binder 8. Anemia cont esa 9. S/p fall w splenic lac and R fem fracture 10. Nutrition - to start Nepro today per NG 11. DNR  Plan - tolerated HD yesterday but today BP's are lower. Check echo.  Will re-evaluate in am, hopefully BP will be up otherwise dialysis would not be safe to try.     Kelly Splinter MD  pager [phone]    cell [phone]  11/06/2014, 12:03 PM     Recent Labs Lab 11/02/14 0911 11/03/14 0358 11/05/14 0849 11/05/14 2330  NA 134* 134* 137 137  K 4.4 4.4 4.8 3.7  CL 94* 95* 97* 96*  CO2 28 28 24 25   GLUCOSE 90 92 66 75  BUN 26* 17 36* 16  CREATININE 3.80* 2.59* 4.71* 3.07*  CALCIUM 9.0 8.9 9.1 8.7*  PHOS 5.5* 4.5  --  4.0    Recent Labs Lab 10/31/14 0456  11/02/14 0911 11/03/14 0358 11/05/14 2330  AST 25  --  26 31  --   ALT 9*  --  10* 11*  --   ALKPHOS 124  --  106 107  --   BILITOT 1.2  --  1.3* 1.0  --   PROT 7.9  --  6.9 7.1  --   ALBUMIN 3.0*  < > 2.8* 2.7* 2.8*  < > = values in this interval not displayed.  Recent  Labs Lab 11/11/2014 2140  11/04/14 0644 11/05/14 0849 11/05/14 2330  WBC 9.5  < > 9.3 10.8* 13.1*  NEUTROABS 7.5  --   --  8.7*  --   HGB 10.6*  < > 10.5* 10.5* 10.3*  HCT 31.8*  < > 33.3* 32.6* 31.9*  MCV 112.0*  < > 114.4* 114.8* 115.6*  PLT 228  < > 259 291 212  < > = values in this interval not displayed. Marland Kitchen anagrelide  0.5 mg Oral QODAY  . antiseptic oral rinse  7 mL Mouth Rinse q12n4p  . chlorhexidine  15 mL Mouth Rinse BID  . cinacalcet  30 mg Oral Q supper  . darbepoetin (ARANESP) injection - DIALYSIS  150 mcg Intravenous Q Fri-HD  . docusate sodium  100 mg Oral BID  . febuxostat  40 mg Oral Daily  . [START ON Nov 20, 2014] ferric gluconate (FERRLECIT/NULECIT) IV  125  mg Intravenous Q Wed-HD  . finasteride  5 mg Oral Daily  . heparin  40 Units/kg Dialysis Once in dialysis  . multivitamin  1 tablet Oral QHS  . neomycin-polymyxin b-dexamethasone   Left Eye QHS  . pantoprazole  40 mg Oral Daily  . sevelamer carbonate  1,600 mg Oral TID WC  . sodium chloride  250 mL Intravenous Once  . sodium chloride  3 mL Intravenous Q12H     sodium chloride, sodium chloride, acetaminophen **OR** acetaminophen, alteplase, feeding supplement (NEPRO CARB STEADY), heparin, levalbuterol, lidocaine (PF), lidocaine-prilocaine, loratadine, ondansetron **OR** ondansetron (ZOFRAN) IV, pentafluoroprop-tetrafluoroeth

## 2014-11-06 NOTE — Progress Notes (Signed)
Speech Language Pathology Discharge Patient Details Name: ARIV PENROD MRN: [id number] DOB: [birth year] Today's Date: 11/06/2014 Time:  -     Patient discharged from SLP services secondary to medical decline - will need to re-order SLP to resume therapy services. Continues with decreased alertness, per chart; remains on Bipap. Please reconsult if status improves.  Please see latest therapy progress note for current level of functioning and progress toward goals.    Progress and discharge plan discussed with patient and/or caregiver: Patient unable to participate in discharge planning and no caregivers available  GO     [name] 11/06/2014, 8:22 AM

## 2014-11-06 NOTE — Progress Notes (Signed)
CSW (Clinical Education officer, museum) aware of consults placed for placement. CSW did introduce self to pt daughter but at this time will not complete a full assessment. Family is visibly emotional and awaiting meeting with palliative team to discuss goals of care/comfort care.  [place], [place]

## 2014-11-06 NOTE — Procedures (Signed)
ELECTROENCEPHALOGRAM REPORT  Patient: Brent Mcneil       Room #: 1I45 EEG No. ID: [id number] Age: 81 y.o.        Sex: male Referring Physician: Venia Minks Report Date:  11/06/2014        Interpreting Physician: Anthony Sar  History: Brent Mcneil is an 81 y.o. male with end-stage renal disease on hemodialysis, myeloproliferative disorder, stroke and atrial fibrillation, admitted with acute femur fracture and splenic laceration. He has altered mental status with suspected hypoactive delirium.  Indications for study:  Assess severity of encephalopathy; rule out seizure activity.  Technique: This is an 18 channel routine scalp EEG performed at the bedside with bipolar and monopolar montages arranged in accordance to the international 10/20 system of electrode placement.   Description: Patient was lethargic during this EEG recording, as well as confused. Predominant background activity consisted of low to moderate amplitude diffuse mixed delta and theta activity as well as frequent occurrences of higher amplitude triphasic sharp waves. Photic stimulation was not performed. Occasional rhythmic activity resembling sleep spindles was recorded from the central head regions. No epileptiform discharges were recorded.  Interpretation: This EEG is abnormal with moderately severe generalized continuous nonspecific slowing of cerebral activity. The presence of triphasic sharp waves's most often associated with metabolic encephalopathy, particularly with hepatic and renal failure, but consistent be seen with other encephalopathic states as well. No evidence of seizure activity was recorded.   Rush Farmer M.D. [facility] [phone]

## 2014-11-06 NOTE — Progress Notes (Signed)
[facility] PROGRESS NOTE  BIJAN RIDGLEY XVQ:[id number] DOB: 21-Jan-1924 DOA: 10/15/2014 PCP: Bonnita Nasuti, MD Interim summary: Brent Mcneil is a 81 y.o. male with history of ESRD on hemodialysis on Monday Wednesday and Friday, chronic atrial fibrillation not on anticoagulation secondary to AVMs, history of GI bleed secondary to AVMs, myelodysplastic syndrome on anagrelide, chronic anemia was brought to the ER to patient had a fall at his living facility, found to have femur fracture and lacerated spleen. Surgery and orthopedics consulted . Recommended non surgical management. Over the course of the hospitalization, patient, became more encephalopathic, went in respiratory failure with hypercarbia. He was transferred to stepdown and bipap was used, even after 3 days of BIPAP, patient declined further and we have requested a palliative care consult and transitioned him to comfort care as per family request.  Assessment/Plan: 1. Grade 3 splenic laceration: Non surgical management, hemoglobin stable. Appreciate surgery recommendations.   2. Right femur fracture: Non opertive in bledsoe Brace per orthopedics.   3. Mild anemia: macrocytic.  Stable.   4. ESRD on HD No further HD treatments as pt transitioned to comfort care.    5. Hypotension: no more fluid boluses.   6. Chronic atrial fibrillation: with rate between 110 to 120/min   7.hypoxia after HD 8/19:  CXR ordered, showed congestion.  Repeat CXR  appears to be clear.   8.  Acute encephalopathy: possibly from respiratory acidosis and hypercarbia along with delirium .  Failed bIPAP.  Neurology consulted and repeat CT head ordered, does not show any acute stroke.  EEG showed metabolic encephalopathy.  Minimally elevated ammonia.   9. Coughing on po intake: - NPO, SLP evaluation, with MBS ordered, but couldn't be done as he is very lethargic. Comfort feeds.   10. GOALS OF CARE: - palliative care consulted and  transitioned to comfort care as per family request.   Code Status:DNR Family Communication: FAMILY AT bedside Disposition Plan: residential hospice.    Consultants:  Surgery  Orthopedics.   Neurology  Palliative care.   Procedures:  none  Antibiotics:  none  HPI/Subjective: SOMNOLENT, non responsive.  Objective: Filed Vitals:   11/06/14 1700  BP: 89/34  Pulse:   Temp:   Resp: 22    Intake/Output Summary (Last 24 hours) at 11/06/14 1835 Last data filed at 11/06/14 1800  Gross per 24 hour  Intake    250 ml  Output    591 ml  Net   -341 ml   Filed Weights   11/04/14 0500 11/05/14 0312 11/06/14 0339  Weight: 98.9 kg (218 lb 0.6 oz) 94.8 kg (208 lb 15.9 oz) 95.709 kg (211 lb)    Exam:   General:  drowsy afebrile  appearscomfortable  Cardiovascular: s1s2 tachycardia  Respiratory: ctab, diminished at bases.   Abdomen: soft mild tender ness int he right upper quadrant, bowel sounds heard  Musculoskeletal: brace on the left leg.   Data Reviewed: Basic Metabolic Panel:  Recent Labs Lab 11/01/14 0156 11/02/14 0911 11/03/14 0358 11/05/14 0849 11/05/14 2330  NA 136 134* 134* 137 137  K 4.3 4.4 4.4 4.8 3.7  CL 97* 94* 95* 97* 96*  CO2 28 28 28 24 25   GLUCOSE 89 90 92 66 75  BUN 17 26* 17 36* 16  CREATININE 2.66* 3.80* 2.59* 4.71* 3.07*  CALCIUM 8.9 9.0 8.9 9.1 8.7*  PHOS 4.2 5.5* 4.5  --  4.0   Liver Function Tests:  Recent Labs Lab 10/25/2014 2140 10/31/14 0456 11/01/14  4696 11/02/14 0911 11/03/14 0358 11/05/14 2330  AST 26 25  --  26 31  --   ALT 9* 9*  --  10* 11*  --   ALKPHOS 128* 124  --  106 107  --   BILITOT 0.9 1.2  --  1.3* 1.0  --   PROT 7.9 7.9  --  6.9 7.1  --   ALBUMIN 2.9* 3.0* 2.8* 2.8* 2.7* 2.8*   No results for input(s): LIPASE, AMYLASE in the last 168 hours.  Recent Labs Lab 10/31/14 0456 11/04/14 1212  AMMONIA 42* 46*   CBC:  Recent Labs Lab 10/29/2014 2140  11/02/14 0911 11/03/14 0358 11/04/14 0644  11/05/14 0849 11/05/14 2330  WBC 9.5  < > 10.7* 10.8* 9.3 10.8* 13.1*  NEUTROABS 7.5  --   --   --   --  8.7*  --   HGB 10.6*  < > 10.5* 9.9* 10.5* 10.5* 10.3*  HCT 31.8*  < > 31.6* 31.1* 33.3* 32.6* 31.9*  MCV 112.0*  < > 111.3* 113.1* 114.4* 114.8* 115.6*  PLT 228  < > 229 188 259 291 212  < > = values in this interval not displayed. Cardiac Enzymes:  Recent Labs Lab 10/31/14 0456  CKTOTAL 28*  TROPONINI 0.13*   BNP (last 3 results) No results for input(s): BNP in the last 8760 hours.  ProBNP (last 3 results) No results for input(s): PROBNP in the last 8760 hours.  CBG:  Recent Labs Lab 10/31/14 1646  GLUCAP 88    Recent Results (from the past 240 hour(s))  MRSA PCR Screening     Status: None   Collection Time: 11/04/14  3:40 PM  Result Value Ref Range Status   MRSA by PCR NEGATIVE NEGATIVE Final    Comment:        The GeneXpert MRSA Assay (FDA approved for NASAL specimens only), is one component of a comprehensive MRSA colonization surveillance program. It is not intended to diagnose MRSA infection nor to guide or monitor treatment for MRSA infections.      Studies: Ct Head Wo Contrast  11/05/2014   CLINICAL DATA:  Decreased level of consciousness  EXAM: CT HEAD WITHOUT CONTRAST  TECHNIQUE: Contiguous axial images were obtained from the base of the skull through the vertex without intravenous contrast.  COMPARISON:  10/31/2014  FINDINGS: The bony calvarium is intact. No gross soft tissue abnormality is noted. No findings to suggest acute hemorrhage, acute infarction or space-occupying mass lesion are noted. An old right parietal infarct is again seen and stable. No other focal abnormality is noted.  IMPRESSION: Chronic right parietal infarct.  No acute abnormality seen.   Electronically Signed   By: Inez Catalina M.D.   On: 11/05/2014 12:20   Dg Chest Port 1 View  11/05/2014   CLINICAL DATA:  Pulmonary edema.  Follow-up.  EXAM: PORTABLE CHEST - 1 VIEW   COMPARISON:  11/02/2014  FINDINGS: Heart is borderline in size. Calcifications throughout the aorta. No confluent airspace opacities or effusions. No acute bony abnormality  IMPRESSION: No active disease.   Electronically Signed   By: Rolm Baptise M.D.   On: 11/05/2014 10:44   Dg Abd Portable 1v  11/05/2014   CLINICAL DATA:  Feeding tube placement.  EXAM: PORTABLE ABDOMEN - 1 VIEW  COMPARISON:  Chest radiograph 11/05/2014  FINDINGS: Feeding tube in the left upper abdomen. Tip is likely in the stomach body region. Small amount of bowel gas in the abdomen.  IMPRESSION:  Feeding tube in the gastric body region.   Electronically Signed   By: Markus Daft M.D.   On: 11/05/2014 18:57    Scheduled Meds: . antiseptic oral rinse  7 mL Mouth Rinse q12n4p  . chlorhexidine  15 mL Mouth Rinse BID  . sodium chloride  250 mL Intravenous Once  . sodium chloride  3 mL Intravenous Q12H  . sodium chloride  3 mL Intravenous Q12H   Continuous Infusions:   Principal Problem:   Splenic laceration Active Problems:   MDS (myelodysplastic syndrome)   Chronic atrial fibrillation   ESRD on dialysis   Chronic anemia   Femur fracture, right   Acute encephalopathy   Malnutrition of moderate degree   Palliative care by specialist   Palliative care encounter   Pressure ulcer    Time spent: 25 minutes/     [facility] Pager [phone]  If 7PM-7AM, please contact night-coverage at [email or web address], password [facility] 11/06/2014, 6:35 PM  LOS: 6 days

## 2014-11-07 DIAGNOSIS — Z515 Encounter for palliative care: Secondary | ICD-10-CM

## 2014-11-15 NOTE — Progress Notes (Signed)
Nutrition Brief Note  Chart reviewed. Pt now transitioning to comfort care.  No further nutrition interventions warranted at this time.  Please re-consult as needed.   Nadirah Socorro A. Laniya Friedl, RD, LDN, CDE Pager: [phone] After hours Pager: [phone]  

## 2014-11-15 NOTE — Progress Notes (Signed)
Patient DNR Comfort care, unresponsive, no respiration, no pulse, pupils are fixed and dilated. Wife and daughter at bedside, on cal MD notified. Time of death 12-06-2014 at 6:51 AM. [place] [facility] notified , not suitable for organ donation. ME called due to history of fall. Bed placement called.

## 2014-11-15 NOTE — Discharge Summary (Signed)
Death Summary  Brent Mcneil HCW:[id number] DOB: October 11, 1923 DOA: 11-26-14  PCP: Bonnita Nasuti, MD PCP/Office notified:   Admit date: 2014/11/26 Date of Death: 2014-12-04  Final Diagnoses:  Principal Problem:   Splenic laceration Active Problems:   MDS (myelodysplastic syndrome)   Chronic atrial fibrillation   ESRD on dialysis   Chronic anemia   Femur fracture, right   Acute encephalopathy   Malnutrition of moderate degree   Palliative care by specialist   Palliative care encounter   Pressure ulcer   Hypoxia    1.   History of present illness:  Hospital Course:  Brent Mcneil is a 81 y.o. male with history of ESRD on hemodialysis on Monday Wednesday and Friday, chronic atrial fibrillation not on anticoagulation secondary to AVMs, history of GI bleed secondary to AVMs, myelodysplastic syndrome on anagrelide, chronic anemia was brought to the ER to patient had a fall at his living facility, found to have femur fracture and lacerated spleen. Surgery and orthopedics consulted . Recommended non surgical management. Over the course of the hospitalization, patient, became more encephalopathic, went in respiratory failure with hypercarbia. He was transferred to stepdown and bipap was used, even after 3 days of BIPAP, patient declined further and we have requested a palliative care consult and transitioned him to comfort care as per family request. He passed away on the morning of 12-04-22.   Time: 7:55 am  Signed:  Javonn Gauger  [facility] 11/14/2014, 6:14 PM

## 2014-11-15 DEATH — deceased

## 2015-10-19 IMAGING — CT CT ANKLE*R* W/O CM
2 of 4 series · 6 of 14 positions shown, 7 images · non-contrast
Comparison: Right ankle radiographs performed earlier today at [DATE]
p.m.

CLINICAL DATA: Fell out of motorized [place], with right ankle and
knee pain. CT recommended on evaluation of plain films. Initial
encounter.

EXAM:
CT OF THE RIGHT ANKLE WITHOUT CONTRAST
TECHNIQUE: Multidetector CT imaging of the right ankle was performed according
to the standard protocol. Multiplanar CT image reconstructions were
also generated.

[Series 3: lower ext 3.0 u90u · axial · 0.39mm/px · z∈[-172,-88]mm · 3 of 57 slices shown, 4 images]
[im 15/57  soft-tissue]
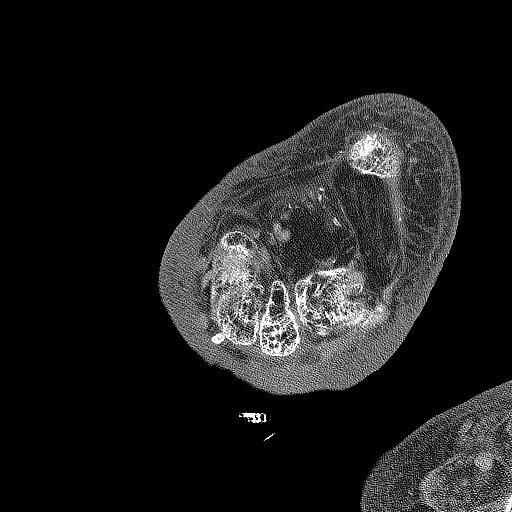
[im 15/57  bone]
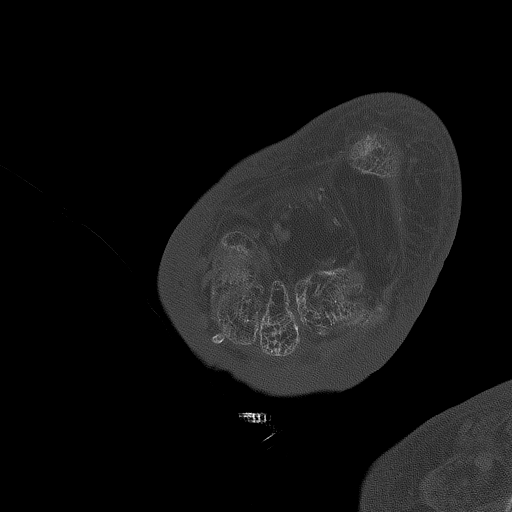
[im 29/57  bone]
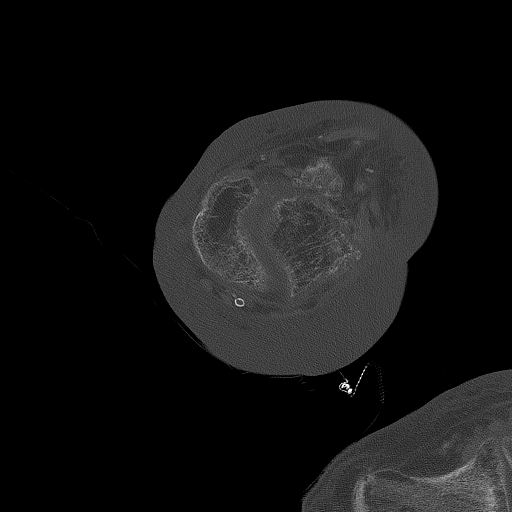
[im 43/57  bone]
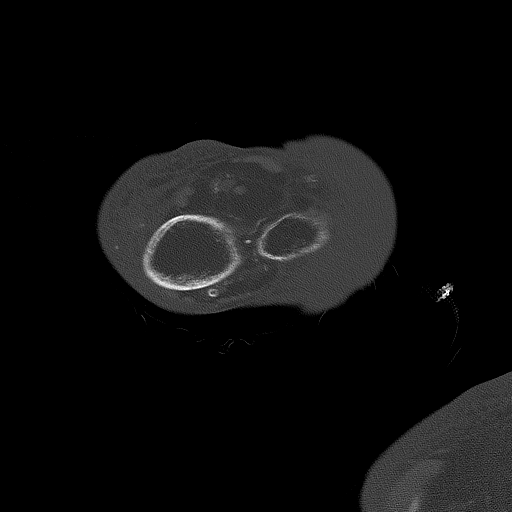

[Series 4: lower ext 3.0 u30u · axial · 0.39mm/px · z∈[-172,-88]mm · 3 of 57 slices shown]
[im 15/57  bone]
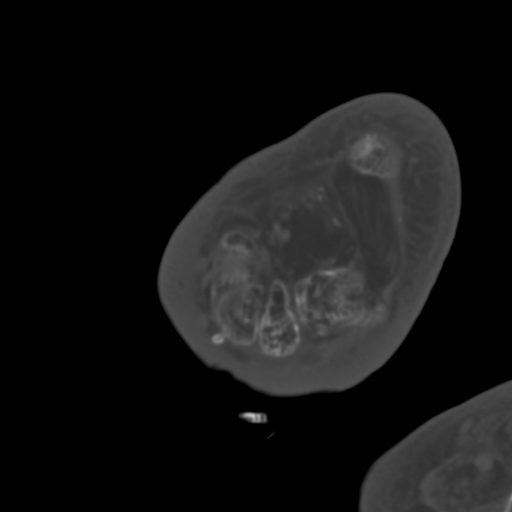
[im 29/57  bone]
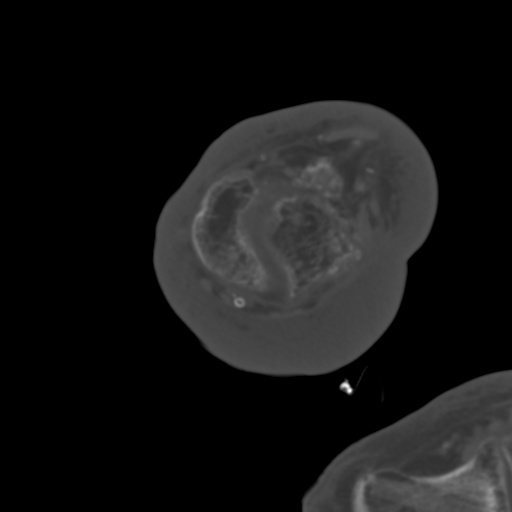
[im 43/57  bone]
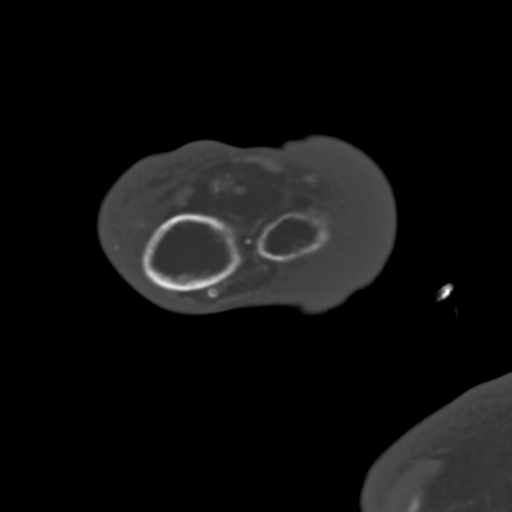

[6 of 14 positions shown; findings below may reference images not displayed]

FINDINGS: There is no definite evidence of fracture or dislocation. Evaluation
is significantly suboptimal due to the extent of osteopenia. There
is diffuse osteopenia involving the visualized osseous structures.
There is some degree of chronic deformity involving the posterior
aspect of the talus, and fusion of the subtalar joint. Diffuse
underlying cortical irregularity is noted at the ankle mortise. A
prominent os trigonum is noted.

The midfoot appears grossly intact, though difficult to fully assess
due to osteopenia.

There is marked diffuse soft tissue edema tracking about the lower
leg, ankle and foot, most prominent along the dorsum of the foot,
and dorsal and lateral aspects of the ankle.

The Achilles tendon appears intact. Visualized flexor and extensor
tendons are grossly unremarkable. The peroneal tendons are grossly
unremarkable, though difficult to fully characterize. There is
diffuse atrophy of the visualized musculature. The vasculature is
difficult to fully assess without contrast. Diffuse vascular
calcifications are seen.
IMPRESSION: 1. No definite evidence of fracture or dislocation. Evaluation is
significantly suboptimal due to extensive osteopenia. Diffuse
osteopenia involves the visualized osseous structures.
2. Chronic deformity involving the posterior aspect of the talus,
and underlying chronic fusion of the subtalar joint. Diffuse
cortical irregularity of the ankle mortise.
3. Marked diffuse soft tissue edema about the lower leg, ankle and
foot, most prominent along the dorsum of the foot, and dorsal and
lateral aspects of the ankle.
4. Prominent os trigonum noted.
5. Diffuse atrophy of the visualized musculature.
6. Diffuse vascular calcifications seen.

## 2015-10-24 IMAGING — CR DG ABD PORTABLE 1V
1 series · 1 of 1 positions shown · non-contrast
Comparison: Chest radiograph 11/05/2014

CLINICAL DATA: Feeding tube placement.

EXAM:
PORTABLE ABDOMEN - 1 VIEW

[AP]
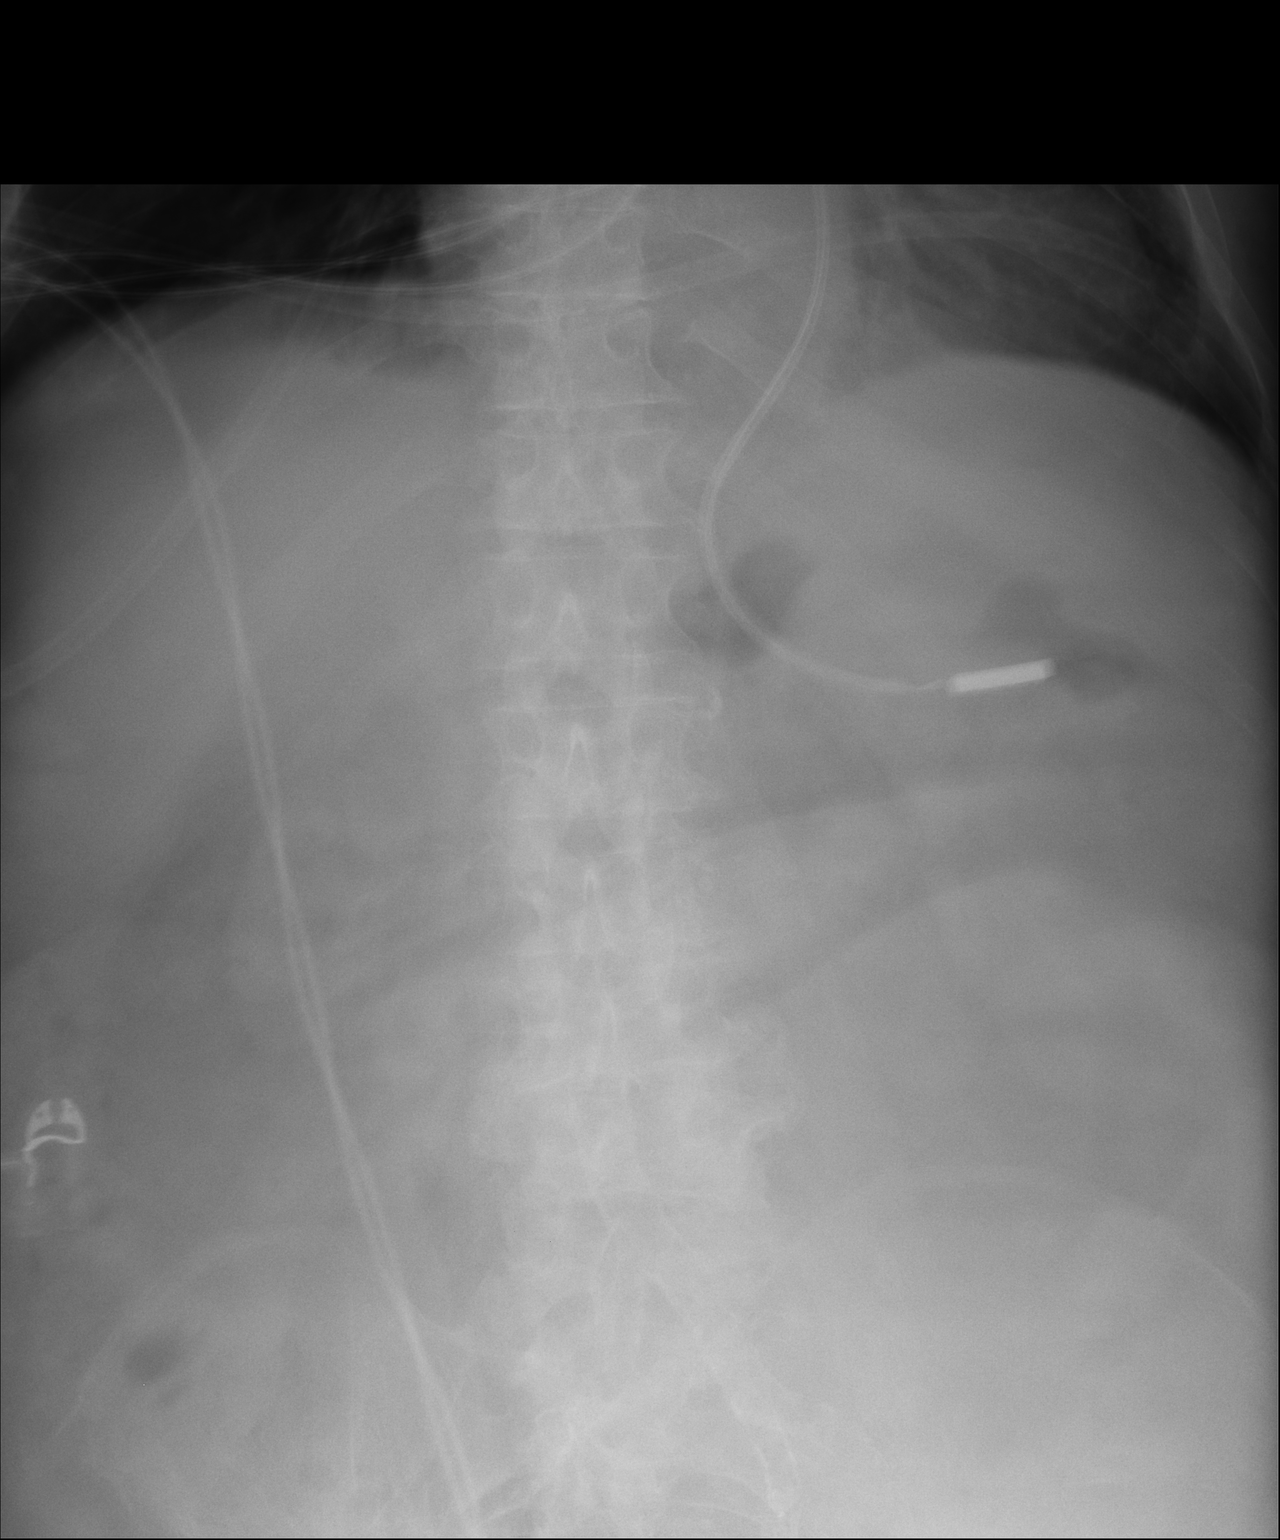

[1 of 1 positions shown; findings below may reference images not displayed]

FINDINGS: Feeding tube in the left upper abdomen. Tip is likely in the stomach
body region. Small amount of bowel gas in the abdomen.
IMPRESSION: Feeding tube in the gastric body region.
